# Patient Record
Sex: Female | Born: 1960
Health system: Southern US, Community
[De-identification: ages and names within clinical notes are randomized; demographics above are authoritative.]

## PROBLEM LIST (undated history)

## (undated) DIAGNOSIS — M797 Fibromyalgia: Secondary | ICD-10-CM

## (undated) DIAGNOSIS — F419 Anxiety disorder, unspecified: Secondary | ICD-10-CM

## (undated) DIAGNOSIS — G709 Myoneural disorder, unspecified: Secondary | ICD-10-CM

## (undated) DIAGNOSIS — H269 Unspecified cataract: Secondary | ICD-10-CM

## (undated) DIAGNOSIS — F329 Major depressive disorder, single episode, unspecified: Secondary | ICD-10-CM

## (undated) DIAGNOSIS — I10 Essential (primary) hypertension: Secondary | ICD-10-CM

## (undated) DIAGNOSIS — F32A Depression, unspecified: Secondary | ICD-10-CM

## (undated) DIAGNOSIS — E119 Type 2 diabetes mellitus without complications: Secondary | ICD-10-CM

## (undated) HISTORY — DX: Type 2 diabetes mellitus without complications: E11.9

## (undated) HISTORY — PX: BILATERAL CARPAL TUNNEL RELEASE: SHX6508

## (undated) HISTORY — PX: BREAST BIOPSY: SHX20

## (undated) HISTORY — DX: Essential (primary) hypertension: I10

## (undated) HISTORY — PX: FRACTURE SURGERY: SHX138

## (undated) HISTORY — DX: Unspecified cataract: H26.9

## (undated) HISTORY — PX: CHOLECYSTECTOMY: SHX55

## (undated) HISTORY — DX: Depression, unspecified: F32.A

## (undated) HISTORY — DX: Anxiety disorder, unspecified: F41.9

## (undated) HISTORY — DX: Myoneural disorder, unspecified: G70.9

## (undated) HISTORY — PX: HERNIA REPAIR: SHX51

---
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
## 2014-06-28 DIAGNOSIS — E1140 Type 2 diabetes mellitus with diabetic neuropathy, unspecified: Secondary | ICD-10-CM | POA: Diagnosis not present

## 2014-06-28 DIAGNOSIS — I10 Essential (primary) hypertension: Secondary | ICD-10-CM | POA: Diagnosis not present

## 2014-06-28 DIAGNOSIS — R5383 Other fatigue: Secondary | ICD-10-CM | POA: Diagnosis not present

## 2014-06-28 DIAGNOSIS — E785 Hyperlipidemia, unspecified: Secondary | ICD-10-CM | POA: Diagnosis not present

## 2014-07-02 DIAGNOSIS — E785 Hyperlipidemia, unspecified: Secondary | ICD-10-CM | POA: Diagnosis not present

## 2014-07-02 DIAGNOSIS — E039 Hypothyroidism, unspecified: Secondary | ICD-10-CM | POA: Diagnosis not present

## 2014-07-02 DIAGNOSIS — I10 Essential (primary) hypertension: Secondary | ICD-10-CM | POA: Diagnosis not present

## 2014-07-02 DIAGNOSIS — E1140 Type 2 diabetes mellitus with diabetic neuropathy, unspecified: Secondary | ICD-10-CM | POA: Diagnosis not present

## 2014-07-02 DIAGNOSIS — R5383 Other fatigue: Secondary | ICD-10-CM | POA: Diagnosis not present

## 2014-08-08 DIAGNOSIS — Z6838 Body mass index (BMI) 38.0-38.9, adult: Secondary | ICD-10-CM | POA: Diagnosis not present

## 2014-08-08 DIAGNOSIS — M549 Dorsalgia, unspecified: Secondary | ICD-10-CM | POA: Diagnosis not present

## 2014-08-08 DIAGNOSIS — F339 Major depressive disorder, recurrent, unspecified: Secondary | ICD-10-CM | POA: Diagnosis not present

## 2014-08-08 DIAGNOSIS — I10 Essential (primary) hypertension: Secondary | ICD-10-CM | POA: Diagnosis not present

## 2014-08-08 DIAGNOSIS — E785 Hyperlipidemia, unspecified: Secondary | ICD-10-CM | POA: Diagnosis not present

## 2014-08-08 DIAGNOSIS — Z029 Encounter for administrative examinations, unspecified: Secondary | ICD-10-CM | POA: Diagnosis not present

## 2014-10-17 DIAGNOSIS — I10 Essential (primary) hypertension: Secondary | ICD-10-CM | POA: Diagnosis not present

## 2014-10-17 DIAGNOSIS — G629 Polyneuropathy, unspecified: Secondary | ICD-10-CM | POA: Diagnosis not present

## 2014-10-17 DIAGNOSIS — E785 Hyperlipidemia, unspecified: Secondary | ICD-10-CM | POA: Diagnosis not present

## 2014-10-17 DIAGNOSIS — E1140 Type 2 diabetes mellitus with diabetic neuropathy, unspecified: Secondary | ICD-10-CM | POA: Diagnosis not present

## 2014-10-17 DIAGNOSIS — F329 Major depressive disorder, single episode, unspecified: Secondary | ICD-10-CM | POA: Diagnosis not present

## 2014-10-17 DIAGNOSIS — Z6838 Body mass index (BMI) 38.0-38.9, adult: Secondary | ICD-10-CM | POA: Diagnosis not present

## 2014-12-25 DIAGNOSIS — F332 Major depressive disorder, recurrent severe without psychotic features: Secondary | ICD-10-CM | POA: Diagnosis not present

## 2015-01-17 DIAGNOSIS — G4700 Insomnia, unspecified: Secondary | ICD-10-CM | POA: Diagnosis not present

## 2015-01-17 DIAGNOSIS — E1140 Type 2 diabetes mellitus with diabetic neuropathy, unspecified: Secondary | ICD-10-CM | POA: Diagnosis not present

## 2015-01-17 DIAGNOSIS — E785 Hyperlipidemia, unspecified: Secondary | ICD-10-CM | POA: Diagnosis not present

## 2015-01-17 DIAGNOSIS — F339 Major depressive disorder, recurrent, unspecified: Secondary | ICD-10-CM | POA: Diagnosis not present

## 2015-01-17 DIAGNOSIS — G8929 Other chronic pain: Secondary | ICD-10-CM | POA: Diagnosis not present

## 2015-01-17 DIAGNOSIS — I10 Essential (primary) hypertension: Secondary | ICD-10-CM | POA: Diagnosis not present

## 2015-02-26 DIAGNOSIS — F332 Major depressive disorder, recurrent severe without psychotic features: Secondary | ICD-10-CM | POA: Diagnosis not present

## 2015-04-24 DIAGNOSIS — I10 Essential (primary) hypertension: Secondary | ICD-10-CM | POA: Diagnosis not present

## 2015-04-24 DIAGNOSIS — F329 Major depressive disorder, single episode, unspecified: Secondary | ICD-10-CM | POA: Diagnosis not present

## 2015-04-24 DIAGNOSIS — G4700 Insomnia, unspecified: Secondary | ICD-10-CM | POA: Diagnosis not present

## 2015-04-24 DIAGNOSIS — G629 Polyneuropathy, unspecified: Secondary | ICD-10-CM | POA: Diagnosis not present

## 2015-04-24 DIAGNOSIS — E1140 Type 2 diabetes mellitus with diabetic neuropathy, unspecified: Secondary | ICD-10-CM | POA: Diagnosis not present

## 2015-04-24 DIAGNOSIS — Z6837 Body mass index (BMI) 37.0-37.9, adult: Secondary | ICD-10-CM | POA: Diagnosis not present

## 2015-07-23 DIAGNOSIS — Z6838 Body mass index (BMI) 38.0-38.9, adult: Secondary | ICD-10-CM | POA: Diagnosis not present

## 2015-07-23 DIAGNOSIS — I10 Essential (primary) hypertension: Secondary | ICD-10-CM | POA: Diagnosis not present

## 2015-07-23 DIAGNOSIS — E1140 Type 2 diabetes mellitus with diabetic neuropathy, unspecified: Secondary | ICD-10-CM | POA: Diagnosis not present

## 2015-07-23 DIAGNOSIS — G8929 Other chronic pain: Secondary | ICD-10-CM | POA: Diagnosis not present

## 2015-07-23 DIAGNOSIS — E039 Hypothyroidism, unspecified: Secondary | ICD-10-CM | POA: Diagnosis not present

## 2015-07-23 DIAGNOSIS — E785 Hyperlipidemia, unspecified: Secondary | ICD-10-CM | POA: Diagnosis not present

## 2015-07-23 DIAGNOSIS — F329 Major depressive disorder, single episode, unspecified: Secondary | ICD-10-CM | POA: Diagnosis not present

## 2015-07-23 DIAGNOSIS — G4700 Insomnia, unspecified: Secondary | ICD-10-CM | POA: Diagnosis not present

## 2015-07-31 DIAGNOSIS — M25511 Pain in right shoulder: Secondary | ICD-10-CM | POA: Diagnosis not present

## 2015-08-06 DIAGNOSIS — F332 Major depressive disorder, recurrent severe without psychotic features: Secondary | ICD-10-CM | POA: Diagnosis not present

## 2015-10-15 DIAGNOSIS — M797 Fibromyalgia: Secondary | ICD-10-CM | POA: Diagnosis not present

## 2015-10-15 DIAGNOSIS — E1149 Type 2 diabetes mellitus with other diabetic neurological complication: Secondary | ICD-10-CM | POA: Diagnosis not present

## 2015-10-15 DIAGNOSIS — I10 Essential (primary) hypertension: Secondary | ICD-10-CM | POA: Diagnosis not present

## 2015-10-15 DIAGNOSIS — E782 Mixed hyperlipidemia: Secondary | ICD-10-CM | POA: Diagnosis not present

## 2015-10-15 DIAGNOSIS — M545 Low back pain: Secondary | ICD-10-CM | POA: Diagnosis not present

## 2015-10-22 DIAGNOSIS — F332 Major depressive disorder, recurrent severe without psychotic features: Secondary | ICD-10-CM | POA: Diagnosis not present

## 2015-11-15 DIAGNOSIS — N39498 Other specified urinary incontinence: Secondary | ICD-10-CM | POA: Diagnosis not present

## 2015-11-15 DIAGNOSIS — E782 Mixed hyperlipidemia: Secondary | ICD-10-CM | POA: Diagnosis not present

## 2015-11-15 DIAGNOSIS — E559 Vitamin D deficiency, unspecified: Secondary | ICD-10-CM | POA: Diagnosis not present

## 2015-11-15 DIAGNOSIS — E119 Type 2 diabetes mellitus without complications: Secondary | ICD-10-CM | POA: Diagnosis not present

## 2015-11-15 DIAGNOSIS — H906 Mixed conductive and sensorineural hearing loss, bilateral: Secondary | ICD-10-CM | POA: Diagnosis not present

## 2015-11-15 DIAGNOSIS — M545 Low back pain: Secondary | ICD-10-CM | POA: Diagnosis not present

## 2015-11-15 DIAGNOSIS — I10 Essential (primary) hypertension: Secondary | ICD-10-CM | POA: Diagnosis not present

## 2015-11-15 DIAGNOSIS — M7541 Impingement syndrome of right shoulder: Secondary | ICD-10-CM | POA: Diagnosis not present

## 2016-04-14 DIAGNOSIS — F332 Major depressive disorder, recurrent severe without psychotic features: Secondary | ICD-10-CM | POA: Diagnosis not present

## 2016-06-17 DIAGNOSIS — E119 Type 2 diabetes mellitus without complications: Secondary | ICD-10-CM | POA: Diagnosis not present

## 2016-06-17 DIAGNOSIS — Z6837 Body mass index (BMI) 37.0-37.9, adult: Secondary | ICD-10-CM | POA: Diagnosis not present

## 2016-06-17 DIAGNOSIS — M797 Fibromyalgia: Secondary | ICD-10-CM | POA: Diagnosis not present

## 2016-06-17 DIAGNOSIS — Z1389 Encounter for screening for other disorder: Secondary | ICD-10-CM | POA: Diagnosis not present

## 2016-09-30 DIAGNOSIS — I10 Essential (primary) hypertension: Secondary | ICD-10-CM | POA: Diagnosis not present

## 2016-09-30 DIAGNOSIS — E669 Obesity, unspecified: Secondary | ICD-10-CM | POA: Diagnosis not present

## 2016-09-30 DIAGNOSIS — Z6834 Body mass index (BMI) 34.0-34.9, adult: Secondary | ICD-10-CM | POA: Diagnosis not present

## 2016-09-30 DIAGNOSIS — E785 Hyperlipidemia, unspecified: Secondary | ICD-10-CM | POA: Diagnosis not present

## 2016-09-30 DIAGNOSIS — E119 Type 2 diabetes mellitus without complications: Secondary | ICD-10-CM | POA: Diagnosis not present

## 2017-02-11 DIAGNOSIS — H524 Presbyopia: Secondary | ICD-10-CM | POA: Diagnosis not present

## 2017-02-11 DIAGNOSIS — H25813 Combined forms of age-related cataract, bilateral: Secondary | ICD-10-CM | POA: Diagnosis not present

## 2017-05-24 DIAGNOSIS — E119 Type 2 diabetes mellitus without complications: Secondary | ICD-10-CM | POA: Diagnosis not present

## 2017-05-24 DIAGNOSIS — Z136 Encounter for screening for cardiovascular disorders: Secondary | ICD-10-CM | POA: Diagnosis not present

## 2017-05-24 DIAGNOSIS — I10 Essential (primary) hypertension: Secondary | ICD-10-CM | POA: Diagnosis not present

## 2017-05-24 DIAGNOSIS — Z131 Encounter for screening for diabetes mellitus: Secondary | ICD-10-CM | POA: Diagnosis not present

## 2017-05-24 DIAGNOSIS — E785 Hyperlipidemia, unspecified: Secondary | ICD-10-CM | POA: Diagnosis not present

## 2017-05-24 DIAGNOSIS — E559 Vitamin D deficiency, unspecified: Secondary | ICD-10-CM | POA: Diagnosis not present

## 2017-06-11 DIAGNOSIS — E1165 Type 2 diabetes mellitus with hyperglycemia: Secondary | ICD-10-CM | POA: Diagnosis not present

## 2017-06-11 DIAGNOSIS — G629 Polyneuropathy, unspecified: Secondary | ICD-10-CM | POA: Diagnosis not present

## 2017-06-11 DIAGNOSIS — E785 Hyperlipidemia, unspecified: Secondary | ICD-10-CM | POA: Diagnosis not present

## 2017-08-23 DIAGNOSIS — E559 Vitamin D deficiency, unspecified: Secondary | ICD-10-CM | POA: Diagnosis not present

## 2017-08-23 DIAGNOSIS — I10 Essential (primary) hypertension: Secondary | ICD-10-CM | POA: Diagnosis not present

## 2017-08-23 DIAGNOSIS — G629 Polyneuropathy, unspecified: Secondary | ICD-10-CM | POA: Diagnosis not present

## 2017-08-23 DIAGNOSIS — E1165 Type 2 diabetes mellitus with hyperglycemia: Secondary | ICD-10-CM | POA: Diagnosis not present

## 2017-08-23 DIAGNOSIS — E785 Hyperlipidemia, unspecified: Secondary | ICD-10-CM | POA: Diagnosis not present

## 2017-11-05 DIAGNOSIS — S6991XA Unspecified injury of right wrist, hand and finger(s), initial encounter: Secondary | ICD-10-CM | POA: Diagnosis not present

## 2017-11-05 DIAGNOSIS — S63641A Sprain of metacarpophalangeal joint of right thumb, initial encounter: Secondary | ICD-10-CM | POA: Diagnosis not present

## 2017-11-22 DIAGNOSIS — M797 Fibromyalgia: Secondary | ICD-10-CM | POA: Diagnosis not present

## 2017-11-22 DIAGNOSIS — F333 Major depressive disorder, recurrent, severe with psychotic symptoms: Secondary | ICD-10-CM | POA: Diagnosis not present

## 2017-11-22 DIAGNOSIS — Z1379 Encounter for other screening for genetic and chromosomal anomalies: Secondary | ICD-10-CM | POA: Diagnosis not present

## 2017-11-22 DIAGNOSIS — F419 Anxiety disorder, unspecified: Secondary | ICD-10-CM | POA: Diagnosis not present

## 2017-11-22 DIAGNOSIS — E1165 Type 2 diabetes mellitus with hyperglycemia: Secondary | ICD-10-CM | POA: Diagnosis not present

## 2017-11-22 DIAGNOSIS — R0602 Shortness of breath: Secondary | ICD-10-CM | POA: Diagnosis not present

## 2017-11-22 DIAGNOSIS — F330 Major depressive disorder, recurrent, mild: Secondary | ICD-10-CM | POA: Diagnosis not present

## 2017-11-22 DIAGNOSIS — E119 Type 2 diabetes mellitus without complications: Secondary | ICD-10-CM | POA: Diagnosis not present

## 2017-11-22 DIAGNOSIS — I10 Essential (primary) hypertension: Secondary | ICD-10-CM | POA: Diagnosis not present

## 2017-11-26 DIAGNOSIS — E119 Type 2 diabetes mellitus without complications: Secondary | ICD-10-CM | POA: Diagnosis not present

## 2017-11-26 DIAGNOSIS — E785 Hyperlipidemia, unspecified: Secondary | ICD-10-CM | POA: Diagnosis not present

## 2017-12-28 DIAGNOSIS — F419 Anxiety disorder, unspecified: Secondary | ICD-10-CM | POA: Diagnosis not present

## 2017-12-28 DIAGNOSIS — E1165 Type 2 diabetes mellitus with hyperglycemia: Secondary | ICD-10-CM | POA: Diagnosis not present

## 2017-12-28 DIAGNOSIS — E785 Hyperlipidemia, unspecified: Secondary | ICD-10-CM | POA: Diagnosis not present

## 2017-12-28 DIAGNOSIS — I10 Essential (primary) hypertension: Secondary | ICD-10-CM | POA: Diagnosis not present

## 2018-01-03 DIAGNOSIS — E119 Type 2 diabetes mellitus without complications: Secondary | ICD-10-CM | POA: Diagnosis not present

## 2018-01-03 DIAGNOSIS — H2513 Age-related nuclear cataract, bilateral: Secondary | ICD-10-CM | POA: Diagnosis not present

## 2018-03-01 DIAGNOSIS — F330 Major depressive disorder, recurrent, mild: Secondary | ICD-10-CM | POA: Diagnosis not present

## 2018-03-01 DIAGNOSIS — E1165 Type 2 diabetes mellitus with hyperglycemia: Secondary | ICD-10-CM | POA: Diagnosis not present

## 2018-03-01 DIAGNOSIS — E785 Hyperlipidemia, unspecified: Secondary | ICD-10-CM | POA: Diagnosis not present

## 2018-03-01 DIAGNOSIS — I10 Essential (primary) hypertension: Secondary | ICD-10-CM | POA: Diagnosis not present

## 2018-05-02 ENCOUNTER — Other Ambulatory Visit: Payer: Self-pay | Admitting: *Deleted

## 2018-05-02 NOTE — Patient Outreach (Signed)
[facility](THN) Care Management  05/02/2018  Toni Chambers November 22, 1960 [id number]   Telephone Screen  Referral Date: 04/29/18 Referral Source: MD referral/HTA UM - Lorinda CreedK Evans-stanley, HTA UM -medium 10 day referral  Referral Reason: member is struggling with affording her insulins since she has entered the coverage gap Insurance: HTA    Outreach attempt # 1 No answer. TH[facility] RN CM left HIPAA compliant voicemail message along with CM's contact info.   Plan: [facility] CM sent an unsuccessful outreach letter and scheduled this patient for another call attempt within 4 business days  [name] L. Noelle PennerGibbs, RN, BSN, CCM [facility] Telephonic Care Management Care Coordinator Office number [phone] Mobile number [phone]  Main [facility] number [phone] Fax number [phone] '

## 2018-05-03 ENCOUNTER — Other Ambulatory Visit: Payer: Self-pay | Admitting: *Deleted

## 2018-05-03 NOTE — Patient Outreach (Addendum)
[facility]TH[facility]) Care Management  05/03/2018  Toni Chambers January 01, 1961 [id number]   Telephone Screen  Referral Date: 04/29/18 Referral Source: MD referral/HTA UM - Lorinda CreedK Evans-stanley, HTA UM -medium 10 day referral  Referral Reason: member is struggling with affording her insulins since she has entered the coverage gap Insurance: HTA    Outreach attempt # 2 No answer, busy signals at [phone] No voicemail message could be left   No answer. [facility] RN CM left HIPAA compliant voicemail message along with CM's contact info at [phone]  Plan: [facility] RN CM scheduled this patient for another call attempt within 4 business days  Zula Hovsepian L. Noelle PennerGibbs, RN, BSN, CCM [facility] Telephonic Care Management Care Coordinator Office number [phone] Mobile number [phone]  Main [facility] number [phone] Fax number [phone] '

## 2018-05-09 ENCOUNTER — Other Ambulatory Visit: Payer: Self-pay | Admitting: *Deleted

## 2018-05-09 NOTE — Patient Outreach (Signed)
[facility](THN) Care Management  05/09/2018  Toni Chambers [birth year] [id number]   Telephone Screen  Referral Date:04/29/18 Referral Source:MD referral/HTA UM - K Evans-stanley, HTA UM -medium 10 day referral Referral Reason:member is struggling with affording her insulins since she has entered the coverage gap Insurance:HTA   Outreach attempt # 3  No answer. [facility] RN CM left HIPAA compliant voicemail message along with CM's contact info at [phone]   Plan: [facility] CM scheduled this patient for case closure within 4 business days  Hudson BendKimberly L. Noelle PennerGibbs, RN, BSN, CCM [facility] Telephonic Care Management Care Coordinator Office number [phone] Mobile number [phone]  Main [facility] number [phone] Fax number [phone]

## 2018-05-13 ENCOUNTER — Other Ambulatory Visit: Payer: Self-pay | Admitting: *Deleted

## 2018-05-13 NOTE — Patient Outreach (Signed)
[facility]) Care Management  05/13/2018  NIKHILA STONER 1960-09-27 [id number]   Case closure for  Referral Date:04/29/18 Referral Source:MD referral/HTA UM - K Evans-stanley, HTA UM -medium 10 day referral Referral Reason:member is struggling with affording her insulins since she has entered the coverage gap Insurance:HTA  Call attempts made on 05/02/18, 05/03/18  and 04/3018 Unsuccessful outreach letter sent on 05/02/18  without a response   Plan [facility] RN CM will close case after no response from patient within 10 business days. Unable to reach [facility] RN CM called the primary care office and left a voice message for the nurse line to inform the office of the concerns voiced in the referral CM stated no response from the patient and left Cm contact number  Routed note to care team members  [place] L. Noelle Penner, RN, BSN, CCM [facility] Telephonic Care Management Care Coordinator Office number [phone] Mobile number [phone]  Main [facility] number [phone] Fax number [phone]

## 2018-05-16 ENCOUNTER — Other Ambulatory Visit: Payer: Self-pay

## 2018-05-16 NOTE — Patient Outreach (Signed)
[facility]) Care Management  05/16/2018  Toni Chambers [birth year] [id number]  TELEPHONE SCREENING Referral date: 04/29/18 Referral source: primary MD Referral reason: unable to afford medication  Insurance: Health team advantage.  Telephone call to patient regarding primary MD referral. HIPAA verified. Explained reason for call. Patient states she was in the coverage gap with her insulin. She state she no longer is in the coverage gap and is able to afford her medication. Patient states she was on 15 units of her insulin and paying a copay for a 90 day supply. Patient states she is now on 20 units of insulin and is paying a copay for a 90 day supply but only getting 75 days worth.  Patient states she is going to talk with her doctor about increasing her oral diabetic medication and possibly decreasing her insulin back to 15 units so that she can get a full 90 day supply.  RNCM discussed and offered [facility] care management services to patient.  Patient declined stating she would talk with her primary care provider first then call [facility] care management if she felt additional assistance was needed.   Patient states she is considering changing her primary care provider. RNCM advised patient to call her HTA concierge and request list of participating providers in her area. Patient verbalized understanding.   PLAN:  RNCM will close patient due to being assessed and having no further needs.  RNCM will send patient [facility] care management brochure/ magnet RNCM will send closure letter to patients primary MD   George Ina RN,BSN,CCM [facility] Telephonic  [phone]

## 2018-06-16 DIAGNOSIS — Z7689 Persons encountering health services in other specified circumstances: Secondary | ICD-10-CM | POA: Diagnosis not present

## 2018-06-16 DIAGNOSIS — E782 Mixed hyperlipidemia: Secondary | ICD-10-CM | POA: Diagnosis not present

## 2018-06-16 DIAGNOSIS — E1159 Type 2 diabetes mellitus with other circulatory complications: Secondary | ICD-10-CM | POA: Diagnosis not present

## 2018-06-16 DIAGNOSIS — I10 Essential (primary) hypertension: Secondary | ICD-10-CM | POA: Diagnosis not present

## 2018-06-16 DIAGNOSIS — E1169 Type 2 diabetes mellitus with other specified complication: Secondary | ICD-10-CM | POA: Diagnosis not present

## 2018-06-23 DIAGNOSIS — E1165 Type 2 diabetes mellitus with hyperglycemia: Secondary | ICD-10-CM | POA: Diagnosis not present

## 2018-06-23 DIAGNOSIS — E1169 Type 2 diabetes mellitus with other specified complication: Secondary | ICD-10-CM | POA: Diagnosis not present

## 2018-06-23 DIAGNOSIS — E1159 Type 2 diabetes mellitus with other circulatory complications: Secondary | ICD-10-CM | POA: Diagnosis not present

## 2018-06-23 DIAGNOSIS — E785 Hyperlipidemia, unspecified: Secondary | ICD-10-CM | POA: Diagnosis not present

## 2018-07-13 DIAGNOSIS — E1165 Type 2 diabetes mellitus with hyperglycemia: Secondary | ICD-10-CM | POA: Diagnosis not present

## 2018-07-13 DIAGNOSIS — Z6836 Body mass index (BMI) 36.0-36.9, adult: Secondary | ICD-10-CM | POA: Diagnosis not present

## 2018-09-14 DIAGNOSIS — E1165 Type 2 diabetes mellitus with hyperglycemia: Secondary | ICD-10-CM | POA: Diagnosis not present

## 2018-09-14 DIAGNOSIS — E1159 Type 2 diabetes mellitus with other circulatory complications: Secondary | ICD-10-CM | POA: Diagnosis not present

## 2018-09-14 DIAGNOSIS — E1169 Type 2 diabetes mellitus with other specified complication: Secondary | ICD-10-CM | POA: Diagnosis not present

## 2018-09-22 DIAGNOSIS — E1169 Type 2 diabetes mellitus with other specified complication: Secondary | ICD-10-CM | POA: Diagnosis not present

## 2018-09-22 DIAGNOSIS — E785 Hyperlipidemia, unspecified: Secondary | ICD-10-CM | POA: Diagnosis not present

## 2018-09-22 DIAGNOSIS — E1159 Type 2 diabetes mellitus with other circulatory complications: Secondary | ICD-10-CM | POA: Diagnosis not present

## 2018-09-22 DIAGNOSIS — I10 Essential (primary) hypertension: Secondary | ICD-10-CM | POA: Diagnosis not present

## 2018-10-18 DIAGNOSIS — I10 Essential (primary) hypertension: Secondary | ICD-10-CM | POA: Diagnosis not present

## 2018-10-18 DIAGNOSIS — E1159 Type 2 diabetes mellitus with other circulatory complications: Secondary | ICD-10-CM | POA: Diagnosis not present

## 2018-11-16 DIAGNOSIS — I160 Hypertensive urgency: Secondary | ICD-10-CM | POA: Diagnosis not present

## 2018-11-16 DIAGNOSIS — Z87891 Personal history of nicotine dependence: Secondary | ICD-10-CM | POA: Diagnosis not present

## 2018-11-16 DIAGNOSIS — R200 Anesthesia of skin: Secondary | ICD-10-CM | POA: Diagnosis not present

## 2018-11-16 DIAGNOSIS — R51 Headache: Secondary | ICD-10-CM | POA: Diagnosis not present

## 2018-11-16 DIAGNOSIS — I208 Other forms of angina pectoris: Secondary | ICD-10-CM | POA: Diagnosis not present

## 2018-11-16 DIAGNOSIS — R739 Hyperglycemia, unspecified: Secondary | ICD-10-CM | POA: Diagnosis not present

## 2018-11-17 DIAGNOSIS — I208 Other forms of angina pectoris: Secondary | ICD-10-CM | POA: Diagnosis not present

## 2018-12-22 DIAGNOSIS — E1159 Type 2 diabetes mellitus with other circulatory complications: Secondary | ICD-10-CM | POA: Diagnosis not present

## 2018-12-22 DIAGNOSIS — E785 Hyperlipidemia, unspecified: Secondary | ICD-10-CM | POA: Diagnosis not present

## 2018-12-22 DIAGNOSIS — Z0000 Encounter for general adult medical examination without abnormal findings: Secondary | ICD-10-CM | POA: Diagnosis not present

## 2018-12-22 DIAGNOSIS — E1169 Type 2 diabetes mellitus with other specified complication: Secondary | ICD-10-CM | POA: Diagnosis not present

## 2018-12-22 DIAGNOSIS — Z789 Other specified health status: Secondary | ICD-10-CM | POA: Diagnosis not present

## 2018-12-22 DIAGNOSIS — Z139 Encounter for screening, unspecified: Secondary | ICD-10-CM | POA: Diagnosis not present

## 2018-12-22 DIAGNOSIS — F329 Major depressive disorder, single episode, unspecified: Secondary | ICD-10-CM | POA: Diagnosis not present

## 2018-12-30 NOTE — Patient Outreach (Signed)
Received a referral from Dr. Nyra Capes, Toni Chambers has HTA insurance, I have transferred the referral to HTA through [facility] email [email or web address] per workflow.

## 2019-01-04 ENCOUNTER — Telehealth: Payer: Self-pay | Admitting: Pharmacist

## 2019-01-04 DIAGNOSIS — Z794 Long term (current) use of insulin: Secondary | ICD-10-CM

## 2019-01-04 DIAGNOSIS — E1169 Type 2 diabetes mellitus with other specified complication: Secondary | ICD-10-CM

## 2019-01-04 DIAGNOSIS — I152 Hypertension secondary to endocrine disorders: Secondary | ICD-10-CM

## 2019-01-04 DIAGNOSIS — E119 Type 2 diabetes mellitus without complications: Secondary | ICD-10-CM

## 2019-01-04 DIAGNOSIS — E1159 Type 2 diabetes mellitus with other circulatory complications: Secondary | ICD-10-CM

## 2019-01-04 DIAGNOSIS — E559 Vitamin D deficiency, unspecified: Secondary | ICD-10-CM

## 2019-01-05 DIAGNOSIS — E559 Vitamin D deficiency, unspecified: Secondary | ICD-10-CM | POA: Insufficient documentation

## 2019-01-05 DIAGNOSIS — I152 Hypertension secondary to endocrine disorders: Secondary | ICD-10-CM | POA: Insufficient documentation

## 2019-01-05 DIAGNOSIS — E119 Type 2 diabetes mellitus without complications: Secondary | ICD-10-CM | POA: Insufficient documentation

## 2019-01-05 DIAGNOSIS — E1159 Type 2 diabetes mellitus with other circulatory complications: Secondary | ICD-10-CM | POA: Insufficient documentation

## 2019-01-05 DIAGNOSIS — E1169 Type 2 diabetes mellitus with other specified complication: Secondary | ICD-10-CM | POA: Insufficient documentation

## 2019-01-05 DIAGNOSIS — H2513 Age-related nuclear cataract, bilateral: Secondary | ICD-10-CM | POA: Diagnosis not present

## 2019-01-05 NOTE — Patient Outreach (Addendum)
[facility](THN) Care Management  [facility]   01/05/2019  Toni Chambers 1960-11-16 [id number]  Reason for referral: Medication Assistance  Referral source: [place] Referral medication(s): Levemir  Current insurance:HTA  PMHx:  Type 2 diabetes, hypertension, hyperlipidemia, vitamin D deficiency   Objective: Allergies  Allergen Reactions  . Codeine Nausea And Vomiting    Feels like sea sickness  . Wellbutrin [Bupropion] Anxiety    Hallucinations  . Tramadol Other (See Comments)    Headache  . Trazodone And Nefazodone Other (See Comments)    Headache    Medications Reviewed Today    Reviewed by Toni Chambers, Toni Chambers, [facility] (Pharmacist) on 01/05/19 at 1127  Med List Status: <None>  Medication Order Taking? Sig Documenting Provider Last Dose Status Informant  aspirin EC 81 MG tablet [id number] Yes Take 81 mg by mouth daily. [provider] Taking Active   atorvastatin (LIPITOR) 40 MG tablet [id number] Yes Take 40 mg by mouth daily. [provider] Taking Active   DULoxetine (CYMBALTA) 20 MG capsule [id number] Yes Take 20 mg by mouth daily. [provider] Taking Active   empagliflozin (JARDIANCE) 10 MG TABS tablet [id number] Yes Take 10 mg by mouth daily. [provider] Taking Active   ergocalciferol (VITAMIN D2) 1.25 MG (50000 UT) capsule [id number] Yes Take 50,000 Units by mouth once a week. [provider] Taking Active   glipiZIDE (GLUCOTROL XL) 2.5 MG 24 hr tablet [id number] Yes Take 2.5 mg by mouth daily with breakfast. [provider] Taking Active   Icosapent Ethyl (VASCEPA PO) [id number] Yes Take by mouth. [provider] Taking Active   insulin detemir (LEVEMIR) 100 UNIT/ML injection [id number] Yes Inject 30 Units into the skin daily. [provider] Taking Active   lisinopril (ZESTRIL) 10 MG tablet [id number] Yes Take 10 mg by mouth daily. [provider] Taking Active   nebivolol  (BYSTOLIC) 5 MG tablet [id number] Yes Take 5 mg by mouth daily. [provider] Taking Active           Assessment:  Drugs sorted by system:  Neurologic/Psychologic: Duloxetine  Cardiovascular: Aspirin, Atorvastatin, Lisinopril, Vascepa, Bystolic  Endocrine: Glipizide, Levemir, Jardiance  Vitamins/Minerals/Supplements: Ergocalciferol   Medication Review Findings:  . Patient reported being given Trulicity. However, the referral and provider note sent over listed Bydureon Bcise.  . Adherence-Vascepa was filled but patient could not afford to pick it or the Jardiance up so she is not currently taking either of them.  Medication Assistance Findings:  Medication assistance needs identified: Levemir, Vascepa, Julien GirtByduren Bcise,   According to HTA: [place] $655.65, TDS [phone]$3,568.54  Patient said she had never heard of Bydureon even though it was written on the provider's note. Patient said she used Trulicity in the past and that it really worked for her. She stated she would like to go back on Trulicity.  Patient's provider will be contacted about switching the patient to Trulicity and abo      ut the potential of getting the Levemir switched to Basaglar as both medications are available from [employer] and if approved, delivered to the patient's home.   From initial review, patient appears to qualify for [facility].      Additional medication assistance options reviewed with patient as warranted:   -Completed an application online with [employer].    Plan: I will route patient assistance letter to [facility] pharmacy technician who will coordinate patient assistance program application process for medications listed above.  [facility] pharmacy  technician will assist with obtaining all required documents from both patient and provider(s) and submit application(s) once completed.    Follow up on [place] on Friday  Send patient a [employer] Immediate  Coupon for [employer] and left a message @ Dr. Nyra Capes office about therapeutic substitutions.  Follow up with the patient within 3 business days on the status of the [place] and to get her the [place].  Toni Chambers, PharmD, [place] Clinical Pharmacist [phone]

## 2019-01-06 ENCOUNTER — Other Ambulatory Visit: Payer: Self-pay | Admitting: Pharmacist

## 2019-01-06 ENCOUNTER — Ambulatory Visit: Payer: PPO | Admitting: Pharmacist

## 2019-01-06 NOTE — Patient Outreach (Signed)
[place] [facility]) Care Management  01/06/2019  Toni Chambers [birth year] [id number]  Patient was called to follow up on medication assistance. Unfortunately she did not answer her home phone and the number listed as her mobile number was busy x 3.  An application was completed online for the [employer] earlier this week. From reviewing their website, the patient has an ID but it was unclear what her benefit amount is.  Plan: Send patient an unsuccessful outreach letter. Call patient back in 5-7 business days.   Elayne Guerin, PharmD, [place] Clinical Pharmacist [phone]

## 2019-01-09 DIAGNOSIS — E785 Hyperlipidemia, unspecified: Secondary | ICD-10-CM | POA: Diagnosis not present

## 2019-01-09 DIAGNOSIS — F329 Major depressive disorder, single episode, unspecified: Secondary | ICD-10-CM | POA: Diagnosis not present

## 2019-01-09 DIAGNOSIS — I10 Essential (primary) hypertension: Secondary | ICD-10-CM | POA: Diagnosis not present

## 2019-01-09 DIAGNOSIS — E1169 Type 2 diabetes mellitus with other specified complication: Secondary | ICD-10-CM | POA: Diagnosis not present

## 2019-01-10 ENCOUNTER — Encounter: Payer: Self-pay | Admitting: Pharmacist

## 2019-01-10 ENCOUNTER — Other Ambulatory Visit: Payer: Self-pay | Admitting: Pharmacy Technician

## 2019-01-10 NOTE — Patient Outreach (Signed)
[place] [facility]) Care Management  01/10/2019  BRIYANNA BILLINGHAM 1960/07/24 [id number]                                        Medication Assistance Referral  Referral From: [place]  Medication/Company: Nancee Liter and Trulicity / Lilly Patient application portion:  Education officer, museum portion: Faxed  to Dr Marco Collie Provider address/fax verified via: Office website     Follow up:  Will follow up with patient in 5-10 business days to confirm application(s) have been received.  Cailen Texeira P. Tamaira Ciriello, [place] Management [phone]

## 2019-01-12 DIAGNOSIS — Z6835 Body mass index (BMI) 35.0-35.9, adult: Secondary | ICD-10-CM | POA: Diagnosis not present

## 2019-01-12 DIAGNOSIS — M79641 Pain in right hand: Secondary | ICD-10-CM | POA: Diagnosis not present

## 2019-01-12 DIAGNOSIS — E1159 Type 2 diabetes mellitus with other circulatory complications: Secondary | ICD-10-CM | POA: Diagnosis not present

## 2019-01-12 DIAGNOSIS — I10 Essential (primary) hypertension: Secondary | ICD-10-CM | POA: Diagnosis not present

## 2019-01-13 ENCOUNTER — Other Ambulatory Visit: Payer: Self-pay | Admitting: Pharmacist

## 2019-01-13 ENCOUNTER — Telehealth: Payer: Self-pay | Admitting: Adult Health Nurse Practitioner

## 2019-01-13 NOTE — Telephone Encounter (Signed)
Called patient to schedule Palliative Consult, no answer and unable to leave a message, mailbox is full.  Will follow-up

## 2019-01-13 NOTE — Patient Outreach (Signed)
[facility] [facility]) Care Management  01/13/2019  Toni Chambers 08-08-1960 [id number]   Called patient to follow up on patient assistance. HIPAA identifiers were obtained. Patient confirmed she received her card from [facility] to help pay for Vascepa.  She was instructed to take it to her local pharmacy and request her prescription be "re-billed" to both her insurance and [employer] Well.  Patient was sent patient assistance applications for Trulicity and Basaglar  earlier this week.  Patient said she was going to take the [place] billing information to her local pharmacy today to request the "rebill".  Plan: Susy Frizzle will follow for medication assistance. Call patient back in 4 weeks for follow up.  Elayne Guerin, PharmD, [place] Clinical Pharmacist [phone]

## 2019-01-18 ENCOUNTER — Other Ambulatory Visit: Payer: Self-pay | Admitting: Pharmacy Technician

## 2019-01-18 NOTE — Patient Outreach (Signed)
[place] [facility]) Care Management  01/18/2019  Toni Chambers [birth year] [id number]   ADDENDUM  Incoming call received from patient in regards to [place] application for Trulicity and [employer].  Spoke to patient, HIPAA identifiers verified.  Patient confirmed receiving the application but she has not mailed it back yet. She informed she would try and have it in the mail by the end of the week.  Will followup with patient in 10-15 business days if not received.  [name] P. Sejla Marzano, [place] Management [phone]

## 2019-01-18 NOTE — Patient Outreach (Signed)
[place] [facility]) Care Management  01/18/2019  Toni Chambers September 05, 1960 [id number]    Unsuccessful outreach call placed to patient in regards to [employer] application for [employer] and Trulicity.  Unfortunately patient did not answer the phone. A HIPAA compliant message was NOT able to be left because the patient's mailbox was full and then the call disconnected.  Was calling patient to inquire if she had received the application.  Will followup with patient in 3-7 business days.  Alfredia Desanctis P. Tashi Band, [place] Management [phone]

## 2019-01-25 ENCOUNTER — Telehealth: Payer: Self-pay | Admitting: Hospice

## 2019-01-25 NOTE — Telephone Encounter (Signed)
Called Dr. Eustaquio Maize Hodges's office back to let them know that patient wanted to know why Palliative was ordered for her.  RN said she will need to speak with Dr. Nyra Capes when she returns to the office tomorrow about this and she will call me back.

## 2019-01-26 DIAGNOSIS — M1811 Unilateral primary osteoarthritis of first carpometacarpal joint, right hand: Secondary | ICD-10-CM | POA: Diagnosis not present

## 2019-01-26 DIAGNOSIS — M65331 Trigger finger, right middle finger: Secondary | ICD-10-CM | POA: Diagnosis not present

## 2019-02-01 ENCOUNTER — Other Ambulatory Visit: Payer: Self-pay | Admitting: Pharmacy Technician

## 2019-02-01 NOTE — Patient Outreach (Signed)
[place] [facility]) Care Management  02/01/2019  Toni Chambers 1961-01-19 [id number]  Successful outreach call placed to patient in regards to [employer] application for [employer] and Trulicity.  Spoke to patient, HIPAA identifiers verified.  Patient informed she just received the information she requested from social security. She informed she would place all information in the mail this week.  Will followup with patient in 10-15 business days if information and application has not been received.  [name] P. Brynlei Klausner, [place] Management [phone]

## 2019-02-02 ENCOUNTER — Other Ambulatory Visit: Payer: Self-pay | Admitting: Pharmacy Technician

## 2019-02-02 DIAGNOSIS — Z6835 Body mass index (BMI) 35.0-35.9, adult: Secondary | ICD-10-CM | POA: Diagnosis not present

## 2019-02-02 NOTE — Patient Outreach (Signed)
[place] [facility]) Care Management  02/02/2019  Toni Chambers Sep 29, 1960 [id number]    Incoming call received from patient in regards to [place] application for [name].  Spoke to patient, HIPAA identifiers verified.  Patient was calling to inquire if I had received her application in the mail. Informed patient I had not received it yet but would be looking for them. Informed patient once I receive them, I will submit them and then followup with her regarding the determine. Patient verbalized understanding.  Will followup with patient as previously scheduled.  [name], [name] Management [phone]

## 2019-02-07 ENCOUNTER — Other Ambulatory Visit: Payer: Self-pay | Admitting: Pharmacy Technician

## 2019-02-07 NOTE — Patient Outreach (Signed)
[facility]) Care Management  02/07/2019  Toni Chambers 09-Feb-1961 [id number]    Received all necessary documents and signatures from both patient and provider for Lilly patient assistance for Basaglar and Trulicity.  Sumitted completed application via fax to [employer].  Will followup with Lilly in 5-10 business days to inquire if a determination has been made.  Toni Chambers, [place] Management [phone]

## 2019-02-08 DIAGNOSIS — Z6835 Body mass index (BMI) 35.0-35.9, adult: Secondary | ICD-10-CM | POA: Diagnosis not present

## 2019-02-08 DIAGNOSIS — E1159 Type 2 diabetes mellitus with other circulatory complications: Secondary | ICD-10-CM | POA: Diagnosis not present

## 2019-02-08 DIAGNOSIS — I10 Essential (primary) hypertension: Secondary | ICD-10-CM | POA: Diagnosis not present

## 2019-02-09 DIAGNOSIS — Z6835 Body mass index (BMI) 35.0-35.9, adult: Secondary | ICD-10-CM | POA: Diagnosis not present

## 2019-02-15 ENCOUNTER — Other Ambulatory Visit: Payer: Self-pay | Admitting: Pharmacy Technician

## 2019-02-15 NOTE — Patient Outreach (Signed)
[place] [facility]) Care Management  02/15/2019  Toni Chambers [birth year] [id number]   Care coordination call placed to [place] in regards to patient's application for [place] and Trulicity.  Spoke to [place] who informed patient had been APPROVED 02/07/2019-05/11/2019. She informed an order was placed on 9/29 and 9/30 but there was no tracking information available for her to give me yet.  Care coordination call placed to [place] in regards to [employer] for [employer] and [employer].  Spoke to Leslie who informed both medications are set to be delivered 02/23/2019.  Will followup with patient in 7-10 business days to inquire if medication was received.  Pearly Bartosik P. Timaya Bojarski, [place] Management [phone]

## 2019-02-16 DIAGNOSIS — E1159 Type 2 diabetes mellitus with other circulatory complications: Secondary | ICD-10-CM | POA: Diagnosis not present

## 2019-02-16 DIAGNOSIS — Z6835 Body mass index (BMI) 35.0-35.9, adult: Secondary | ICD-10-CM | POA: Diagnosis not present

## 2019-02-16 DIAGNOSIS — I10 Essential (primary) hypertension: Secondary | ICD-10-CM | POA: Diagnosis not present

## 2019-02-17 ENCOUNTER — Ambulatory Visit: Payer: Self-pay | Admitting: Pharmacist

## 2019-02-17 DIAGNOSIS — R634 Abnormal weight loss: Secondary | ICD-10-CM | POA: Diagnosis not present

## 2019-02-17 DIAGNOSIS — E1159 Type 2 diabetes mellitus with other circulatory complications: Secondary | ICD-10-CM | POA: Diagnosis not present

## 2019-02-17 DIAGNOSIS — I10 Essential (primary) hypertension: Secondary | ICD-10-CM | POA: Diagnosis not present

## 2019-02-17 DIAGNOSIS — Z6834 Body mass index (BMI) 34.0-34.9, adult: Secondary | ICD-10-CM | POA: Diagnosis not present

## 2019-02-22 NOTE — Telephone Encounter (Signed)
Spoke with patient regarding Palliative services and after a lengthy discussion with patient she requested that I call MD office to find out why they had ordered Palliative services for her.  I tried to explain to her why they had ordered Palliative services from the referral that was sent to [place] but she insisted that I call MD office.  Will f/u with MD office and let them know.

## 2019-02-27 ENCOUNTER — Other Ambulatory Visit: Payer: Self-pay | Admitting: Pharmacy Technician

## 2019-02-27 NOTE — Patient Outreach (Signed)
[place] [facility]) Care Management  02/27/2019  MELAYA HOSELTON 1960/10/07 [id number]   Successful outreach call placed to patient in regards to [employer] application for Trulicity and [employer].  Spoke to patient, HIPAA identifiers verified.  Patient informed she received the medications. Patient said she received 4 or 6 boxes of each. Per the [facility] rep, patient should have received 4 month supply of each which is  4 boxes. Patient informed that is probably correct. Discussed refill procedure with patient. Patient verbalized understanding. Confirmed patient had name and number.  Will route note to [place] that patient assistance has been completed. Will remove myself from care team.  Luiz Ochoa. Brennon Otterness, [place] Management [phone]

## 2019-03-06 ENCOUNTER — Ambulatory Visit: Payer: Self-pay | Admitting: Pharmacist

## 2019-03-06 ENCOUNTER — Other Ambulatory Visit: Payer: Self-pay | Admitting: Pharmacist

## 2019-03-06 ENCOUNTER — Telehealth: Payer: Self-pay | Admitting: Hospice

## 2019-03-06 NOTE — Telephone Encounter (Signed)
Called patient to follow-up with her to see if MD office had contacted her to explain to her why they had ordered Palliative services - no answer, left message requesting a call back.  Patient had requested I contact MD office to find out why they ordered Palliative.

## 2019-03-09 DIAGNOSIS — I10 Essential (primary) hypertension: Secondary | ICD-10-CM | POA: Diagnosis not present

## 2019-03-09 DIAGNOSIS — Z6834 Body mass index (BMI) 34.0-34.9, adult: Secondary | ICD-10-CM | POA: Diagnosis not present

## 2019-03-10 DIAGNOSIS — I10 Essential (primary) hypertension: Secondary | ICD-10-CM | POA: Diagnosis not present

## 2019-03-10 DIAGNOSIS — Z6834 Body mass index (BMI) 34.0-34.9, adult: Secondary | ICD-10-CM | POA: Diagnosis not present

## 2019-03-10 DIAGNOSIS — E1159 Type 2 diabetes mellitus with other circulatory complications: Secondary | ICD-10-CM | POA: Diagnosis not present

## 2019-03-14 ENCOUNTER — Telehealth: Payer: Self-pay

## 2019-03-14 NOTE — Telephone Encounter (Signed)
Spoke with patient regarding referral to Palliative Care. Patient shared that decision was made to cancel referral as she is doing well and does not feel this is needed at this time.

## 2019-03-14 NOTE — Telephone Encounter (Signed)
Message left for Tammy with Dr. Nyra Capes office to provide update that patient declined Palliative Care services at this time.

## 2019-04-03 DIAGNOSIS — E1169 Type 2 diabetes mellitus with other specified complication: Secondary | ICD-10-CM | POA: Diagnosis not present

## 2019-04-03 DIAGNOSIS — E1159 Type 2 diabetes mellitus with other circulatory complications: Secondary | ICD-10-CM | POA: Diagnosis not present

## 2019-04-03 DIAGNOSIS — Z23 Encounter for immunization: Secondary | ICD-10-CM | POA: Diagnosis not present

## 2019-04-03 DIAGNOSIS — E785 Hyperlipidemia, unspecified: Secondary | ICD-10-CM | POA: Diagnosis not present

## 2019-04-03 DIAGNOSIS — I10 Essential (primary) hypertension: Secondary | ICD-10-CM | POA: Diagnosis not present

## 2019-04-09 DIAGNOSIS — I10 Essential (primary) hypertension: Secondary | ICD-10-CM | POA: Diagnosis not present

## 2019-04-10 DIAGNOSIS — E1159 Type 2 diabetes mellitus with other circulatory complications: Secondary | ICD-10-CM | POA: Diagnosis not present

## 2019-04-10 DIAGNOSIS — I10 Essential (primary) hypertension: Secondary | ICD-10-CM | POA: Diagnosis not present

## 2019-04-10 DIAGNOSIS — E1169 Type 2 diabetes mellitus with other specified complication: Secondary | ICD-10-CM | POA: Diagnosis not present

## 2019-04-10 DIAGNOSIS — E785 Hyperlipidemia, unspecified: Secondary | ICD-10-CM | POA: Diagnosis not present

## 2019-04-12 ENCOUNTER — Other Ambulatory Visit: Payer: Self-pay | Admitting: Family Medicine

## 2019-04-13 DIAGNOSIS — Z6831 Body mass index (BMI) 31.0-31.9, adult: Secondary | ICD-10-CM | POA: Diagnosis not present

## 2019-04-27 DIAGNOSIS — Z6831 Body mass index (BMI) 31.0-31.9, adult: Secondary | ICD-10-CM | POA: Diagnosis not present

## 2019-05-10 DIAGNOSIS — E785 Hyperlipidemia, unspecified: Secondary | ICD-10-CM | POA: Diagnosis not present

## 2019-05-10 DIAGNOSIS — E1169 Type 2 diabetes mellitus with other specified complication: Secondary | ICD-10-CM | POA: Diagnosis not present

## 2019-05-10 DIAGNOSIS — I10 Essential (primary) hypertension: Secondary | ICD-10-CM | POA: Diagnosis not present

## 2019-05-11 DIAGNOSIS — I10 Essential (primary) hypertension: Secondary | ICD-10-CM | POA: Diagnosis not present

## 2019-06-01 DIAGNOSIS — Z6831 Body mass index (BMI) 31.0-31.9, adult: Secondary | ICD-10-CM | POA: Diagnosis not present

## 2019-06-08 DIAGNOSIS — I152 Hypertension secondary to endocrine disorders: Secondary | ICD-10-CM | POA: Diagnosis not present

## 2019-06-08 DIAGNOSIS — M5136 Other intervertebral disc degeneration, lumbar region: Secondary | ICD-10-CM | POA: Diagnosis not present

## 2019-06-08 DIAGNOSIS — E1159 Type 2 diabetes mellitus with other circulatory complications: Secondary | ICD-10-CM | POA: Diagnosis not present

## 2019-06-08 DIAGNOSIS — M25511 Pain in right shoulder: Secondary | ICD-10-CM | POA: Diagnosis not present

## 2019-06-09 DIAGNOSIS — M5136 Other intervertebral disc degeneration, lumbar region: Secondary | ICD-10-CM | POA: Diagnosis not present

## 2019-06-09 DIAGNOSIS — E1159 Type 2 diabetes mellitus with other circulatory complications: Secondary | ICD-10-CM | POA: Diagnosis not present

## 2019-06-09 DIAGNOSIS — I152 Hypertension secondary to endocrine disorders: Secondary | ICD-10-CM | POA: Diagnosis not present

## 2019-06-15 DIAGNOSIS — Z6829 Body mass index (BMI) 29.0-29.9, adult: Secondary | ICD-10-CM | POA: Diagnosis not present

## 2019-07-06 DIAGNOSIS — E1169 Type 2 diabetes mellitus with other specified complication: Secondary | ICD-10-CM | POA: Diagnosis not present

## 2019-07-09 DIAGNOSIS — M5136 Other intervertebral disc degeneration, lumbar region: Secondary | ICD-10-CM | POA: Diagnosis not present

## 2019-07-09 DIAGNOSIS — E1159 Type 2 diabetes mellitus with other circulatory complications: Secondary | ICD-10-CM | POA: Diagnosis not present

## 2019-07-09 DIAGNOSIS — I152 Hypertension secondary to endocrine disorders: Secondary | ICD-10-CM | POA: Diagnosis not present

## 2019-07-13 DIAGNOSIS — E1169 Type 2 diabetes mellitus with other specified complication: Secondary | ICD-10-CM | POA: Diagnosis not present

## 2019-07-13 DIAGNOSIS — E1159 Type 2 diabetes mellitus with other circulatory complications: Secondary | ICD-10-CM | POA: Diagnosis not present

## 2019-07-13 DIAGNOSIS — E663 Overweight: Secondary | ICD-10-CM | POA: Diagnosis not present

## 2019-07-13 DIAGNOSIS — E785 Hyperlipidemia, unspecified: Secondary | ICD-10-CM | POA: Diagnosis not present

## 2019-07-14 DIAGNOSIS — I152 Hypertension secondary to endocrine disorders: Secondary | ICD-10-CM | POA: Diagnosis not present

## 2019-07-14 DIAGNOSIS — E1169 Type 2 diabetes mellitus with other specified complication: Secondary | ICD-10-CM | POA: Diagnosis not present

## 2019-07-14 DIAGNOSIS — E785 Hyperlipidemia, unspecified: Secondary | ICD-10-CM | POA: Diagnosis not present

## 2019-07-14 DIAGNOSIS — E1159 Type 2 diabetes mellitus with other circulatory complications: Secondary | ICD-10-CM | POA: Diagnosis not present

## 2019-07-20 ENCOUNTER — Encounter: Payer: Self-pay | Admitting: Physical Medicine & Rehabilitation

## 2019-07-20 ENCOUNTER — Other Ambulatory Visit: Payer: Self-pay

## 2019-07-20 ENCOUNTER — Encounter: Payer: PPO | Attending: Physical Medicine & Rehabilitation | Admitting: Physical Medicine & Rehabilitation

## 2019-07-20 VITALS — BP 111/74 | HR 65 | Temp 98.5°F | Ht 59.25 in | Wt 142.0 lb

## 2019-07-20 DIAGNOSIS — M25512 Pain in left shoulder: Secondary | ICD-10-CM | POA: Insufficient documentation

## 2019-07-20 DIAGNOSIS — M25511 Pain in right shoulder: Secondary | ICD-10-CM | POA: Insufficient documentation

## 2019-07-20 DIAGNOSIS — G8929 Other chronic pain: Secondary | ICD-10-CM | POA: Diagnosis not present

## 2019-07-20 NOTE — Patient Instructions (Signed)
Shoulder Impingement Syndrome Rehab Ask your health care provider which exercises are safe for you. Do exercises exactly as told by your health care provider and adjust them as directed. It is normal to feel mild stretching, pulling, tightness, or discomfort as you do these exercises. Stop right away if you feel sudden pain or your pain gets worse. Do not begin these exercises until told by your health care provider. Stretching and range-of-motion exercise This exercise warms up your muscles and joints and improves the movement and flexibility of your shoulder. This exercise also helps to relieve pain and stiffness. Passive horizontal adduction In passive adduction, you use your other hand to move the injured arm toward your body. The injured arm does not move on its own. In this movement, your arm is moved across your body in the horizontal plane (horizontal adduction). 1. Sit or stand and pull your left / right elbow across your chest, toward your other shoulder. Stop when you feel a gentle stretch in the back of your shoulder and upper arm. ? Keep your arm at shoulder height. ? Keep your arm as close to your body as you comfortably can. 2. Hold for __________ seconds. 3. Slowly return to the starting position. Repeat __________ times. Complete this exercise __________ times a day. Strengthening exercises These exercises build strength and endurance in your shoulder. Endurance is the ability to use your muscles for a long time, even after they get tired. External rotation, isometric This is an exercise in which you press the back of your wrist against a door frame without moving your shoulder joint (isometric). 1. Stand or sit in a doorway, facing the door frame. 2. Bend your left / right elbow and place the back of your wrist against the door frame. Only the back of your wrist should be touching the frame. Keep your upper arm at your side. 3. Gently press your wrist against the door frame, as if  you are trying to push your arm away from your abdomen (external rotation). Press as hard as you are able without pain. ? Avoid shrugging your shoulder while you press your wrist against the door frame. Keep your shoulder blade tucked down toward the middle of your back. 4. Hold for __________ seconds. 5. Slowly release the tension, and relax your muscles completely before you repeat the exercise. Repeat __________ times. Complete this exercise __________ times a day. Internal rotation, isometric This is an exercise in which you press your palm against a door frame without moving your shoulder joint (isometric). 1. Stand or sit in a doorway, facing the door frame. 2. Bend your left / right elbow and place the palm of your hand against the door frame. Only your palm should be touching the frame. Keep your upper arm at your side. 3. Gently press your hand against the door frame, as if you are trying to push your arm toward your abdomen (internal rotation). Press as hard as you are able without pain. ? Avoid shrugging your shoulder while you press your hand against the door frame. Keep your shoulder blade tucked down toward the middle of your back. 4. Hold for __________ seconds. 5. Slowly release the tension, and relax your muscles completely before you repeat the exercise. Repeat __________ times. Complete this exercise __________ times a day. Scapular protraction, supine  1. Lie on your back on a firm surface (supine position). Hold a __________ weight in your left / right hand. 2. Raise your left / right arm straight   into the air so your hand is directly above your shoulder joint. 3. Push the weight into the air so your shoulder (scapula) lifts off the surface that you are lying on. The scapula will push up or forward (protraction). Do not move your head, neck, or back. 4. Hold for __________ seconds. 5. Slowly return to the starting position. Let your muscles relax completely before you repeat  this exercise. Repeat __________ times. Complete this exercise __________ times a day. Scapular retraction  1. Sit in a stable chair without armrests, or stand up. 2. Secure an exercise band to a stable object in front of you so the band is at shoulder height. 3. Hold one end of the exercise band in each hand. Your palms should face down. 4. Squeeze your shoulder blades together (retraction) and move your elbows slightly behind you. Do not shrug your shoulders upward while you do this. 5. Hold for __________ seconds. 6. Slowly return to the starting position. Repeat __________ times. Complete this exercise __________ times a day. Shoulder extension  1. Sit in a stable chair without armrests, or stand up. 2. Secure an exercise band to a stable object in front of you so the band is above shoulder height. 3. Hold one end of the exercise band in each hand. 4. Straighten your elbows and lift your hands up to shoulder height. 5. Squeeze your shoulder blades together and pull your hands down to the sides of your thighs (extension). Stop when your hands are straight down by your sides. Do not let your hands go behind your body. 6. Hold for __________ seconds. 7. Slowly return to the starting position. Repeat __________ times. Complete this exercise __________ times a day. This information is not intended to replace advice given to you by your health care provider. Make sure you discuss any questions you have with your health care provider. Document Revised: 08/19/2018 Document Reviewed: 05/23/2018 Elsevier Patient Education  2020 [employer]  
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
## 2019-07-26 DIAGNOSIS — M65331 Trigger finger, right middle finger: Secondary | ICD-10-CM | POA: Diagnosis not present

## 2019-07-27 DIAGNOSIS — E663 Overweight: Secondary | ICD-10-CM | POA: Diagnosis not present

## 2019-07-28 ENCOUNTER — Other Ambulatory Visit: Payer: Self-pay | Admitting: Orthopedic Surgery

## 2019-08-09 DIAGNOSIS — I152 Hypertension secondary to endocrine disorders: Secondary | ICD-10-CM | POA: Diagnosis not present

## 2019-08-09 DIAGNOSIS — E785 Hyperlipidemia, unspecified: Secondary | ICD-10-CM | POA: Diagnosis not present

## 2019-08-09 DIAGNOSIS — E1159 Type 2 diabetes mellitus with other circulatory complications: Secondary | ICD-10-CM | POA: Diagnosis not present

## 2019-08-09 DIAGNOSIS — E1169 Type 2 diabetes mellitus with other specified complication: Secondary | ICD-10-CM | POA: Diagnosis not present

## 2019-08-10 DIAGNOSIS — Z6828 Body mass index (BMI) 28.0-28.9, adult: Secondary | ICD-10-CM | POA: Diagnosis not present

## 2019-08-10 DIAGNOSIS — E663 Overweight: Secondary | ICD-10-CM | POA: Diagnosis not present

## 2019-08-16 ENCOUNTER — Encounter (HOSPITAL_BASED_OUTPATIENT_CLINIC_OR_DEPARTMENT_OTHER): Payer: Self-pay | Admitting: Orthopedic Surgery

## 2019-08-16 ENCOUNTER — Other Ambulatory Visit: Payer: Self-pay

## 2019-08-17 ENCOUNTER — Ambulatory Visit: Payer: PPO | Admitting: Physical Medicine & Rehabilitation

## 2019-08-21 ENCOUNTER — Other Ambulatory Visit (HOSPITAL_COMMUNITY)
Admission: RE | Admit: 2019-08-21 | Discharge: 2019-08-21 | Disposition: A | Discharge status: Home / Self Care | Payer: PPO | Source: Ambulatory Visit | Attending: Orthopedic Surgery | Admitting: Orthopedic Surgery

## 2019-08-21 ENCOUNTER — Encounter (HOSPITAL_BASED_OUTPATIENT_CLINIC_OR_DEPARTMENT_OTHER)
Admission: RE | Admit: 2019-08-21 | Discharge: 2019-08-21 | Disposition: A | Discharge status: Home / Self Care | Payer: PPO | Source: Ambulatory Visit | Attending: Orthopedic Surgery | Admitting: Orthopedic Surgery

## 2019-08-21 DIAGNOSIS — Z833 Family history of diabetes mellitus: Secondary | ICD-10-CM | POA: Diagnosis not present

## 2019-08-21 DIAGNOSIS — M65331 Trigger finger, right middle finger: Secondary | ICD-10-CM | POA: Diagnosis not present

## 2019-08-21 DIAGNOSIS — M797 Fibromyalgia: Secondary | ICD-10-CM | POA: Diagnosis not present

## 2019-08-21 DIAGNOSIS — I10 Essential (primary) hypertension: Secondary | ICD-10-CM | POA: Diagnosis not present

## 2019-08-21 DIAGNOSIS — Z20822 Contact with and (suspected) exposure to covid-19: Secondary | ICD-10-CM | POA: Insufficient documentation

## 2019-08-21 DIAGNOSIS — Z79899 Other long term (current) drug therapy: Secondary | ICD-10-CM | POA: Diagnosis not present

## 2019-08-21 DIAGNOSIS — Z8249 Family history of ischemic heart disease and other diseases of the circulatory system: Secondary | ICD-10-CM | POA: Diagnosis not present

## 2019-08-21 DIAGNOSIS — Z87891 Personal history of nicotine dependence: Secondary | ICD-10-CM | POA: Diagnosis not present

## 2019-08-21 DIAGNOSIS — Z794 Long term (current) use of insulin: Secondary | ICD-10-CM | POA: Diagnosis not present

## 2019-08-21 DIAGNOSIS — Z01812 Encounter for preprocedural laboratory examination: Secondary | ICD-10-CM | POA: Diagnosis not present

## 2019-08-21 DIAGNOSIS — Z888 Allergy status to other drugs, medicaments and biological substances status: Secondary | ICD-10-CM | POA: Diagnosis not present

## 2019-08-21 DIAGNOSIS — F329 Major depressive disorder, single episode, unspecified: Secondary | ICD-10-CM | POA: Diagnosis not present

## 2019-08-21 DIAGNOSIS — Z885 Allergy status to narcotic agent status: Secondary | ICD-10-CM | POA: Diagnosis not present

## 2019-08-21 DIAGNOSIS — E1136 Type 2 diabetes mellitus with diabetic cataract: Secondary | ICD-10-CM | POA: Diagnosis not present

## 2019-08-21 DIAGNOSIS — F419 Anxiety disorder, unspecified: Secondary | ICD-10-CM | POA: Diagnosis not present

## 2019-08-21 DIAGNOSIS — Z7982 Long term (current) use of aspirin: Secondary | ICD-10-CM | POA: Diagnosis not present

## 2019-08-21 LAB — BASIC METABOLIC PANEL
Anion gap: 8 (ref 5–15)
BUN: 21 mg/dL — ABNORMAL HIGH (ref 6–20)
CO2: 27 mmol/L (ref 22–32)
Calcium: 9.3 mg/dL (ref 8.9–10.3)
Chloride: 103 mmol/L (ref 98–111)
Creatinine, Ser: 0.86 mg/dL (ref 0.44–1.00)
GFR calc Af Amer: >60 mL/min (ref >60)
GFR calc non Af Amer: >60 mL/min (ref >60)
Glucose, Bld: 90 mg/dL (ref 70–99)
Potassium: 5.3 mmol/L — ABNORMAL HIGH (ref 3.5–5.1)
Sodium: 138 mmol/L (ref 135–145)

## 2019-08-21 LAB — SARS CORONAVIRUS 2 (TAT 6-24 HRS): SARS Coronavirus 2: NEGATIVE

## 2019-08-21 NOTE — Progress Notes (Signed)

## 2019-08-21 NOTE — Progress Notes (Signed)
K+5.3, Dr. Salvadore Farber aware.

## 2019-08-24 ENCOUNTER — Ambulatory Visit (HOSPITAL_BASED_OUTPATIENT_CLINIC_OR_DEPARTMENT_OTHER)
Admission: RE | Admit: 2019-08-24 | Discharge: 2019-08-24 | Disposition: A | Discharge status: Home / Self Care | Payer: PPO | Attending: Orthopedic Surgery | Admitting: Orthopedic Surgery

## 2019-08-24 ENCOUNTER — Other Ambulatory Visit: Payer: Self-pay

## 2019-08-24 ENCOUNTER — Ambulatory Visit (HOSPITAL_BASED_OUTPATIENT_CLINIC_OR_DEPARTMENT_OTHER): Payer: PPO | Admitting: Anesthesiology

## 2019-08-24 ENCOUNTER — Encounter (HOSPITAL_BASED_OUTPATIENT_CLINIC_OR_DEPARTMENT_OTHER)
Admission: RE | Disposition: A | Discharge status: Home / Self Care | Payer: Self-pay | Source: Home / Self Care | Attending: Orthopedic Surgery

## 2019-08-24 ENCOUNTER — Encounter (HOSPITAL_BASED_OUTPATIENT_CLINIC_OR_DEPARTMENT_OTHER): Payer: Self-pay | Admitting: Orthopedic Surgery

## 2019-08-24 DIAGNOSIS — Z7982 Long term (current) use of aspirin: Secondary | ICD-10-CM | POA: Insufficient documentation

## 2019-08-24 DIAGNOSIS — Z888 Allergy status to other drugs, medicaments and biological substances status: Secondary | ICD-10-CM | POA: Insufficient documentation

## 2019-08-24 DIAGNOSIS — F419 Anxiety disorder, unspecified: Secondary | ICD-10-CM | POA: Insufficient documentation

## 2019-08-24 DIAGNOSIS — F418 Other specified anxiety disorders: Secondary | ICD-10-CM | POA: Diagnosis not present

## 2019-08-24 DIAGNOSIS — E1136 Type 2 diabetes mellitus with diabetic cataract: Secondary | ICD-10-CM | POA: Insufficient documentation

## 2019-08-24 DIAGNOSIS — I10 Essential (primary) hypertension: Secondary | ICD-10-CM | POA: Insufficient documentation

## 2019-08-24 DIAGNOSIS — M797 Fibromyalgia: Secondary | ICD-10-CM | POA: Diagnosis not present

## 2019-08-24 DIAGNOSIS — E119 Type 2 diabetes mellitus without complications: Secondary | ICD-10-CM | POA: Diagnosis not present

## 2019-08-24 DIAGNOSIS — F329 Major depressive disorder, single episode, unspecified: Secondary | ICD-10-CM | POA: Diagnosis not present

## 2019-08-24 DIAGNOSIS — Z87891 Personal history of nicotine dependence: Secondary | ICD-10-CM | POA: Insufficient documentation

## 2019-08-24 DIAGNOSIS — M65331 Trigger finger, right middle finger: Secondary | ICD-10-CM | POA: Insufficient documentation

## 2019-08-24 DIAGNOSIS — Z885 Allergy status to narcotic agent status: Secondary | ICD-10-CM | POA: Insufficient documentation

## 2019-08-24 DIAGNOSIS — Z79899 Other long term (current) drug therapy: Secondary | ICD-10-CM | POA: Diagnosis not present

## 2019-08-24 DIAGNOSIS — Z794 Long term (current) use of insulin: Secondary | ICD-10-CM | POA: Diagnosis not present

## 2019-08-24 DIAGNOSIS — Z8249 Family history of ischemic heart disease and other diseases of the circulatory system: Secondary | ICD-10-CM | POA: Insufficient documentation

## 2019-08-24 DIAGNOSIS — Z833 Family history of diabetes mellitus: Secondary | ICD-10-CM | POA: Insufficient documentation

## 2019-08-24 HISTORY — PX: TRIGGER FINGER RELEASE: SHX641

## 2019-08-24 HISTORY — DX: Fibromyalgia: M79.7

## 2019-08-24 LAB — GLUCOSE, CAPILLARY
Glucose-Capillary: 82 mg/dL (ref 70–99)
Glucose-Capillary: 80 mg/dL (ref 70–99)

## 2019-08-24 SURGERY — RELEASE, A1 PULLEY, FOR TRIGGER FINGER
Anesthesia: Regional | Site: Hand | Laterality: Right

## 2019-08-24 MED ORDER — LACTATED RINGERS IV SOLN: INTRAVENOUS | Status: DC

## 2019-08-24 MED ORDER — PROMETHAZINE HCL 25 MG/ML IJ SOLN: 6.2500 mg | INTRAMUSCULAR | Status: DC | PRN

## 2019-08-24 MED ORDER — CEFAZOLIN SODIUM-DEXTROSE 2-4 GM/100ML-% IV SOLN
INTRAVENOUS | Status: AC
  Filled 2019-08-24: qty 100

## 2019-08-24 MED ORDER — PROPOFOL 500 MG/50ML IV EMUL
INTRAVENOUS | Status: DC | PRN
  Administered 2019-08-24: 50 ug/kg/min via INTRAVENOUS

## 2019-08-24 MED ORDER — LIDOCAINE HCL (PF) 0.5 % IJ SOLN
INTRAMUSCULAR | Status: DC | PRN
  Administered 2019-08-24: 32 mL via INTRAVENOUS

## 2019-08-24 MED ORDER — OXYCODONE HCL 5 MG/5ML PO SOLN: 5.0000 mg | Freq: Once | ORAL | Status: DC | PRN

## 2019-08-24 MED ORDER — HYDROCODONE-ACETAMINOPHEN 5-325 MG PO TABS: ORAL_TABLET | ORAL | 0 refills | Status: DC

## 2019-08-24 MED ORDER — BUPIVACAINE HCL (PF) 0.25 % IJ SOLN
INTRAMUSCULAR | Status: DC | PRN
  Administered 2019-08-24: 5 mL

## 2019-08-24 MED ORDER — OXYCODONE HCL 5 MG PO TABS: 5.0000 mg | ORAL_TABLET | Freq: Once | ORAL | Status: DC | PRN

## 2019-08-24 MED ORDER — CHLORHEXIDINE GLUCONATE 4 % EX LIQD: 60.0000 mL | Freq: Once | CUTANEOUS | Status: DC

## 2019-08-24 MED ORDER — MIDAZOLAM HCL 5 MG/5ML IJ SOLN
INTRAMUSCULAR | Status: DC | PRN
  Administered 2019-08-24: 2 mg via INTRAVENOUS

## 2019-08-24 MED ORDER — FENTANYL CITRATE (PF) 100 MCG/2ML IJ SOLN
INTRAMUSCULAR | Status: AC
  Filled 2019-08-24: qty 2

## 2019-08-24 MED ORDER — FENTANYL CITRATE (PF) 100 MCG/2ML IJ SOLN
INTRAMUSCULAR | Status: DC | PRN
  Administered 2019-08-24 (×2): 50 ug via INTRAVENOUS

## 2019-08-24 MED ORDER — HYDROMORPHONE HCL 1 MG/ML IJ SOLN: 0.2500 mg | INTRAMUSCULAR | Status: DC | PRN

## 2019-08-24 MED ORDER — CEFAZOLIN SODIUM-DEXTROSE 2-4 GM/100ML-% IV SOLN
2.0000 g | INTRAVENOUS | Status: AC
  Administered 2019-08-24: 2 g via INTRAVENOUS

## 2019-08-24 MED ORDER — MIDAZOLAM HCL 2 MG/2ML IJ SOLN
INTRAMUSCULAR | Status: AC
  Filled 2019-08-24: qty 2

## 2019-08-24 SURGICAL SUPPLY — 36 items
BLADE SURG 15 STRL LF DISP TIS (BLADE) ×2 IMPLANT
BLADE SURG 15 STRL SS (BLADE) ×2
BNDG COHESIVE 2X5 TAN STRL LF (GAUZE/BANDAGES/DRESSINGS) ×2 IMPLANT
BNDG ESMARK 4X9 LF (GAUZE/BANDAGES/DRESSINGS) ×1 IMPLANT
CHLORAPREP W/TINT 26 (MISCELLANEOUS) ×2 IMPLANT
CORD BIPOLAR FORCEPS 12FT (ELECTRODE) ×2 IMPLANT
COVER BACK TABLE 60X90IN (DRAPES) ×2 IMPLANT
COVER MAYO STAND STRL (DRAPES) ×2 IMPLANT
COVER WAND RF STERILE (DRAPES) IMPLANT
CUFF TOURN SGL QUICK 18X4 (TOURNIQUET CUFF) ×2 IMPLANT
DRAPE EXTREMITY T 121X128X90 (DISPOSABLE) ×2 IMPLANT
DRAPE SURG 17X23 STRL (DRAPES) ×2 IMPLANT
GAUZE SPONGE 4X4 12PLY STRL (GAUZE/BANDAGES/DRESSINGS) ×2 IMPLANT
GAUZE XEROFORM 1X8 LF (GAUZE/BANDAGES/DRESSINGS) ×2 IMPLANT
GLOVE BIO SURGEON STRL SZ7.5 (GLOVE) ×2 IMPLANT
GLOVE BIOGEL PI IND STRL 6.5 (GLOVE) IMPLANT
GLOVE BIOGEL PI IND STRL 7.5 (GLOVE) IMPLANT
GLOVE BIOGEL PI IND STRL 8 (GLOVE) ×1 IMPLANT
GLOVE BIOGEL PI INDICATOR 6.5 (GLOVE) ×1
GLOVE BIOGEL PI INDICATOR 7.5 (GLOVE) ×1
GLOVE BIOGEL PI INDICATOR 8 (GLOVE) ×2
GLOVE ECLIPSE 8.0 STRL XLNG CF (GLOVE) ×1 IMPLANT
GLOVE SURG SS PI 7.0 STRL IVOR (GLOVE) ×1 IMPLANT
GOWN STRL REUS W/ TWL LRG LVL3 (GOWN DISPOSABLE) ×1 IMPLANT
GOWN STRL REUS W/TWL LRG LVL3 (GOWN DISPOSABLE) ×1
GOWN STRL REUS W/TWL XL LVL3 (GOWN DISPOSABLE) ×3 IMPLANT
NDL HYPO 25X1 1.5 SAFETY (NEEDLE) ×1 IMPLANT
NEEDLE HYPO 25X1 1.5 SAFETY (NEEDLE) ×2 IMPLANT
NS IRRIG 1000ML POUR BTL (IV SOLUTION) ×2 IMPLANT
PACK BASIN DAY SURGERY FS (CUSTOM PROCEDURE TRAY) ×2 IMPLANT
STOCKINETTE 4X48 STRL (DRAPES) ×2 IMPLANT
SUT ETHILON 4 0 PS 2 18 (SUTURE) ×2 IMPLANT
SYR BULB 3OZ (MISCELLANEOUS) ×2 IMPLANT
SYR CONTROL 10ML LL (SYRINGE) ×2 IMPLANT
TOWEL GREEN STERILE FF (TOWEL DISPOSABLE) ×4 IMPLANT
UNDERPAD 30X36 HEAVY ABSORB (UNDERPADS AND DIAPERS) ×2 IMPLANT

## 2019-08-24 NOTE — H&P (Signed)
Toni Chambers is an 59 y.o. female.   Chief Complaint: right middle finger trigger digit HPI: 59 yo female with triggering right middle finger.  This has been injected without lasting resolution.  She wishes to have a trigger release.  Allergies:  Allergies  Allergen Reactions  . Codeine Nausea And Vomiting    Feels like sea sickness  . Wellbutrin [Bupropion] Anxiety    Hallucinations, uncontrolled anxiety   . Gabapentin Other (See Comments)    Made patient fall  . Tramadol Other (See Comments)    Headache  . Trazodone And Nefazodone Other (See Comments)    Headache  . Elavil [Amitriptyline] Other (See Comments)    Headache    Past Medical History:  Diagnosis Date  . Anxiety   . Cataract   . Depression   . Diabetes mellitus without complication (HCC)   . Fibromyalgia   . Hypertension   . Neuromuscular disorder [facility])    Fibromyalgia    Past Surgical History:  Procedure Laterality Date  . BILATERAL CARPAL TUNNEL RELEASE    . CHOLECYSTECTOMY    . FRACTURE SURGERY     humerus  . HERNIA REPAIR    . TONSILLECTOMY  1970    Family History: Family History  Problem Relation Age of Onset  . Hypertension Mother   . Cancer Mother   . Diabetes Mother   . Heart disease Father   . Cancer Father   . Depression Father   . Hypertension Father   . Diabetes Sister     Social History:   reports that she quit smoking about 30 years ago. Her smoking use included cigarettes. She has never used smokeless tobacco. She reports previous alcohol use. She reports that she does not use drugs.  Medications: Medications Prior to Admission  Medication Sig Dispense Refill  . acetaminophen (TYLENOL) 500 MG tablet Take 500 mg by mouth every 6 (six) hours as needed.    Marland Kitchen aspirin EC 81 MG tablet Take 81 mg by mouth daily.    . Dulaglutide (TRULICITY) 1.5 MG/0.5ML SOPN Inject 1.5 mg into the skin once a week.    . DULoxetine (CYMBALTA) 20 MG capsule Take 20 mg by mouth daily.    Marland Kitchen  glipiZIDE (GLUCOTROL XL) 2.5 MG 24 hr tablet Take 2.5 mg by mouth daily with breakfast.    . Icosapent Ethyl (VASCEPA PO) Take by mouth.    . insulin detemir (LEVEMIR) 100 UNIT/ML injection Inject 35 Units into the skin daily.     Marland Kitchen lidocaine (LMX) 4 % cream Apply 1 application topically as needed.    Marland Kitchen lisinopril (ZESTRIL) 10 MG tablet Take 5 mg by mouth daily.     . naproxen sodium (ALEVE) 220 MG tablet Take 220 mg by mouth.    . nebivolol (BYSTOLIC) 5 MG tablet Take 5 mg by mouth daily.    . rosuvastatin (CRESTOR) 20 MG tablet Take 20 mg by mouth daily.    Marland Kitchen trolamine salicylate (ASPERCREME) 10 % cream Apply 1 application topically as needed for muscle pain.    . ergocalciferol (VITAMIN D2) 1.25 MG (50000 UT) capsule Take 50,000 Units by mouth once a week.      No results found for this or any previous visit (from the past 48 hour(s)).  No results found.   A comprehensive review of systems was negative.  Blood pressure 116/69, pulse (!) 59, temperature 97.9 F (36.6 C), temperature source Oral, resp. rate 16, height 4' 11.5" (1.511 m), weight  63 kg, SpO2 100 %.  General appearance: alert, cooperative and appears stated age Head: Normocephalic, without obvious abnormality, atraumatic Neck: supple, symmetrical, trachea midline Cardio: regular rate and rhythm Resp: clear to auscultation bilaterally Extremities: Intact sensation and capillary refill all digits.  +epl/fpl/io.  No wounds.  Pulses: 2+ and symmetric Skin: Skin color, texture, turgor normal. No rashes or lesions Neurologic: Grossly normal Incision/Wound: none  Assessment/Plan Right middle finger trigger digit.  Non operative and operative treatment options have been discussed with the patient and patient wishes to proceed with operative treatment. Risks, benefits, and alternatives of surgery have been discussed and the patient agrees with the plan of care.   Leanora Cover 08/24/2019, 8:14 AM

## 2019-08-24 NOTE — Transfer of Care (Signed)
Immediate Anesthesia Transfer of Care Note  Patient: Toni Chambers  Procedure(s) Performed: RELEASE TRIGGER FINGER/A-1 PULLEY (Right Hand)  Patient Location: PACU  Anesthesia Type:MAC  Level of Consciousness: awake and alert   Airway & Oxygen Therapy: Patient Spontanous Breathing and Patient connected to face mask oxygen  Post-op Assessment: Report given to RN and Post -op Vital signs reviewed and stable  Post vital signs: Reviewed and stable  Last Vitals:  Vitals Value Taken Time  BP    Temp    Pulse 66 08/24/19 1054  Resp 12 08/24/19 1054  SpO2 100 % 08/24/19 1054  Vitals shown include unvalidated device data.  Last Pain:  Vitals:   08/24/19 0810  TempSrc: Oral  PainSc: 2       Patients Stated Pain Goal: 5 (41/96/22 2979)  Complications: No apparent anesthesia complications

## 2019-08-24 NOTE — Anesthesia Procedure Notes (Signed)
Anesthesia Regional Block: Bier block (IV Regional)   Pre-Anesthetic Checklist: ,, timeout performed, Correct Patient, Correct Site, Correct Laterality, Correct Procedure,, site marked, surgical consent,, at surgeon's request  Laterality: Right  Prep: chloraprep       Needles:  Injection technique: Single-shot  Needle Type: Other      Needle Gauge: 20     Additional Needles:   Procedures:,,,,, intact distal pulses, Esmarch exsanguination, single tourniquet utilized,  Narrative:  Start time: 08/24/2019 10:28 AM End time: 08/24/2019 10:29 AM Injection made incrementally with aspirations every 32 mL.  Performed by: Personally

## 2019-08-24 NOTE — Discharge Instructions (Addendum)

## 2019-08-24 NOTE — Op Note (Signed)
08/24/2019 [facility]  Operative Note  PREOPERATIVE DIAGNOSIS: RIGHT MIDDLE FINGER TRIGGER  POSTOPERATIVE DIAGNOSIS:  RIGHT MIDDLE FINGER TRIGGER  PROCEDURE: Procedure(s): RELEASE TRIGGER FINGER/A-1 PULLEY  SURGEON:  Betha Loa, MD  ASSISTANT:  none.  ANESTHESIA:  Bier block with sedation.  IV FLUIDS:  Per anesthesia flow sheet.  ESTIMATED BLOOD LOSS:  Minimal.  COMPLICATIONS:  None.  SPECIMENS:  None.  TOURNIQUET TIME:  Total Tourniquet Time Documented: Forearm (Right) - 20 minutes Total: Forearm (Right) - 20 minutes   DISPOSITION:  Stable to PACU.  LOCATION: [facility]  INDICATIONS: Toni Chambers is a 59 y.o. female with triggering right long finger.  This has been injected without lasting resolution.  She wishes to have a release of the right long finger trigger digit.  Risks, benefits and alternatives of surgery were discussed including the risk of blood loss, infection, damage to nerves, vessels, tendons, ligaments, bone, failure of surgery, need for additional surgery, complications with wound healing, continued pain, continued triggering and need for repeat surgery.  She voiced understanding of these risks and elected to proceed.  OPERATIVE COURSE:  After being identified preoperatively by myself, the patient and I agreed upon the procedure and site of procedure.  The surgical site was marked. The risks, benefits, and alternatives of surgery were reviewed and she wished to proceed.  Surgical consent had been signed. She was given IV Ancef as preoperative antibiotic prophylaxis. She was transported to the operating room and placed on the operating room table in supine position with the Right upper extremity on an arm board. Bier block anesthesia was induced by the anesthesiologist.  The Right upper extremity was prepped and draped in normal sterile orthopedic fashion. A surgical pause was performed between surgeons, anesthesia, and operating  room staff, and all were in agreement as to the patient, procedure, and site of procedure.  Tourniquet at the proximal aspect of the forearm had been inflated for the Bier block.  An incision was made at the volar aspect of the MP joint of the long finger.  This was carried into the subcutaneous tissues by preading technique.  Bipolar electrocautery was used to obtain hemostasis.  The radial and ulnar digital nerves were protected throughout the case. The flexor sheath was identified.  The A1 pulley was identified and sharply incised.  It was released in its entirety.  The proximal 1-2 mm of the A2 pulley was vented to allow better excursion of the tendons.  The finger was placed through a range of motion and there was noted to be no catching.  The tendons were brought through the wound and any adherences released.  The wound was then copiously irrigated with sterile saline. It was closed with 4-0 nylon in a horizontal mattress fashion.  It was injected with 0.25% plain Marcaine to aid in postoperative analgesia.  It was dressed with sterile Xeroform, 4x4s, and wrapped lightly with a Coban dressing.  Tourniquet was deflated at 20 minutes.  The fingertips were pink with brisk capillary refill after deflation of the tourniquet.  The operative drapes were broken down and the patient was awoken from anesthesia safely.  She was transferred back to the stretcher and taken to the PACU in stable condition.   I will see her back in the office in 1 week for postoperative followup.  I will give her a prescription for Norco 5/325 1-2 tabs PO q6 hours prn pain, dispense # 20 which she states she can take.  Leanora Cover, MD Electronically signed, 08/24/19

## 2019-08-24 NOTE — Anesthesia Postprocedure Evaluation (Signed)
Anesthesia Post Note  Patient: Toni Chambers  Procedure(s) Performed: RELEASE TRIGGER FINGER/A-1 PULLEY (Right Hand)     Patient location during evaluation: PACU Anesthesia Type: Bier Block Level of consciousness: awake and alert Pain management: pain level controlled Vital Signs Assessment: post-procedure vital signs reviewed and stable Respiratory status: spontaneous breathing, nonlabored ventilation and respiratory function stable Cardiovascular status: blood pressure returned to baseline and stable Postop Assessment: no apparent nausea or vomiting Anesthetic complications: no    Last Vitals:  Vitals:   08/24/19 1115 08/24/19 1145  BP: 96/61 (!) 99/59  Pulse: 60 (!) 56  Resp: (!) 21 18  Temp:  36.8 C  SpO2: 98% 99%    Last Pain:  Vitals:   08/24/19 1130  TempSrc:   PainSc: 0-No pain                 Lowella Curb

## 2019-08-24 NOTE — Anesthesia Preprocedure Evaluation (Signed)
Anesthesia Evaluation  Patient identified by MRN, date of birth, ID band Patient awake    Reviewed: Allergy & Precautions, NPO status , Patient's Chart, lab work & pertinent test results  Airway Mallampati: II  TM Distance: >3 FB Neck ROM: Full    Dental no notable dental hx.    Pulmonary neg pulmonary ROS, former smoker,    Pulmonary exam normal breath sounds clear to auscultation       Cardiovascular hypertension, Pt. on medications negative cardio ROS Normal cardiovascular exam Rhythm:Regular Rate:Normal     Neuro/Psych Anxiety Depression negative neurological ROS  negative psych ROS   GI/Hepatic negative GI ROS, Neg liver ROS,   Endo/Other  negative endocrine ROSdiabetes  Renal/GU negative Renal ROS  negative genitourinary   Musculoskeletal  (+) Fibromyalgia -  Abdominal   Peds negative pediatric ROS (+)  Hematology negative hematology ROS (+)   Anesthesia Other Findings   Reproductive/Obstetrics negative OB ROS                             Anesthesia Physical Anesthesia Plan  ASA: III  Anesthesia Plan: Bier Block and Bier Block-LIDOCAINE ONLY   Post-op Pain Management:    Induction: Intravenous  PONV Risk Score and Plan: 2 and Ondansetron, Midazolam and Treatment may vary due to age or medical condition  Airway Management Planned: Simple Face Mask  Additional Equipment:   Intra-op Plan:   Post-operative Plan:   Informed Consent: I have reviewed the patients History and Physical, chart, labs and discussed the procedure including the risks, benefits and alternatives for the proposed anesthesia with the patient or authorized representative who has indicated his/her understanding and acceptance.     Dental advisory given  Plan Discussed with: CRNA  Anesthesia Plan Comments:         Anesthesia Quick Evaluation

## 2019-08-25 ENCOUNTER — Encounter: Payer: Self-pay | Admitting: *Deleted

## 2019-09-01 ENCOUNTER — Other Ambulatory Visit: Payer: Self-pay

## 2019-09-01 ENCOUNTER — Encounter: Payer: PPO | Attending: Physical Medicine & Rehabilitation | Admitting: Physical Medicine & Rehabilitation

## 2019-09-01 ENCOUNTER — Encounter: Payer: Self-pay | Admitting: Physical Medicine & Rehabilitation

## 2019-09-01 VITALS — BP 101/57 | HR 64 | Temp 97.7°F | Ht 59.5 in | Wt 137.0 lb

## 2019-09-01 DIAGNOSIS — M25511 Pain in right shoulder: Secondary | ICD-10-CM | POA: Diagnosis not present

## 2019-09-01 DIAGNOSIS — M25512 Pain in left shoulder: Secondary | ICD-10-CM

## 2019-09-01 DIAGNOSIS — G8929 Other chronic pain: Secondary | ICD-10-CM | POA: Diagnosis not present
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
## 2019-09-01 NOTE — Patient Instructions (Signed)
Will order MRI of RIght shoulder to evaluate for rotator cuff tear

## 2019-09-07 DIAGNOSIS — E1169 Type 2 diabetes mellitus with other specified complication: Secondary | ICD-10-CM | POA: Diagnosis not present

## 2019-09-07 DIAGNOSIS — E785 Hyperlipidemia, unspecified: Secondary | ICD-10-CM | POA: Diagnosis not present

## 2019-09-08 DIAGNOSIS — I152 Hypertension secondary to endocrine disorders: Secondary | ICD-10-CM | POA: Diagnosis not present

## 2019-09-08 DIAGNOSIS — E1159 Type 2 diabetes mellitus with other circulatory complications: Secondary | ICD-10-CM | POA: Diagnosis not present

## 2019-09-08 DIAGNOSIS — E1169 Type 2 diabetes mellitus with other specified complication: Secondary | ICD-10-CM | POA: Diagnosis not present

## 2019-09-08 DIAGNOSIS — E785 Hyperlipidemia, unspecified: Secondary | ICD-10-CM | POA: Diagnosis not present

## 2019-09-11 ENCOUNTER — Telehealth: Payer: Self-pay

## 2019-09-11 ENCOUNTER — Telehealth: Payer: Self-pay | Admitting: Physical Medicine & Rehabilitation

## 2019-09-11 DIAGNOSIS — G8929 Other chronic pain: Secondary | ICD-10-CM

## 2019-09-11 DIAGNOSIS — M25512 Pain in left shoulder: Secondary | ICD-10-CM

## 2019-09-11 DIAGNOSIS — G894 Chronic pain syndrome: Secondary | ICD-10-CM

## 2019-09-11 DIAGNOSIS — M25511 Pain in right shoulder: Secondary | ICD-10-CM

## 2019-09-11 NOTE — Telephone Encounter (Signed)
Order placed

## 2019-09-12 ENCOUNTER — Ambulatory Visit
Admission: RE | Admit: 2019-09-12 | Discharge: 2019-09-12 | Disposition: A | Discharge status: Home / Self Care | Payer: PPO | Source: Ambulatory Visit | Attending: Physical Medicine & Rehabilitation | Admitting: Physical Medicine & Rehabilitation

## 2019-09-12 ENCOUNTER — Other Ambulatory Visit: Payer: Self-pay

## 2019-09-12 DIAGNOSIS — M25511 Pain in right shoulder: Secondary | ICD-10-CM

## 2019-09-12 DIAGNOSIS — M25512 Pain in left shoulder: Secondary | ICD-10-CM

## 2019-09-12 DIAGNOSIS — G8929 Other chronic pain: Secondary | ICD-10-CM

## 2019-09-12 DIAGNOSIS — M75121 Complete rotator cuff tear or rupture of right shoulder, not specified as traumatic: Secondary | ICD-10-CM | POA: Diagnosis not present

## 2019-09-12 DIAGNOSIS — G894 Chronic pain syndrome: Secondary | ICD-10-CM

## 2019-09-12 MED ORDER — IOPAMIDOL (ISOVUE-M 200) INJECTION 41%
12.0000 mL | Freq: Once | INTRAMUSCULAR | Status: AC
  Administered 2019-09-12: 12 mL via INTRA_ARTICULAR

## 2019-09-13 NOTE — Telephone Encounter (Signed)
Pt had questions about CT scan scheduling

## 2019-09-14 ENCOUNTER — Telehealth: Payer: Self-pay | Admitting: *Deleted

## 2019-09-14 NOTE — Telephone Encounter (Signed)
I gave Toni Chambers the results.  She has an appt on 09/29/19 and that is the earliest available appt. Not sure the injection would help. She really would like a call from Toni Chambers who can explain it more in detail about her questions involving the tears.  Her number is # [phone].

## 2019-09-14 NOTE — Telephone Encounter (Signed)
-----   Message from Erick Colace, MD sent at 09/14/2019 12:46 PM EDT ----- Main findings are supraspinatus tear I am not sure whether this is operable but would need orthopedic surgery to evaluate this.  The acromioclavicular joint arthritis also may be causing quite a bit of pain.  I can do an injection on this to see if this is helpful.  This would need to be done under ultrasound guidance.  Please see if patient would like to schedule the injection.

## 2019-09-15 NOTE — Telephone Encounter (Signed)
Please change next visit from follow-up to ultrasound-guided right acromioclavicular joint injection

## 2019-09-25 ENCOUNTER — Other Ambulatory Visit: Payer: PPO

## 2019-09-29 ENCOUNTER — Other Ambulatory Visit: Payer: Self-pay

## 2019-09-29 ENCOUNTER — Encounter: Payer: PPO | Attending: Physical Medicine & Rehabilitation | Admitting: Physical Medicine & Rehabilitation

## 2019-09-29 ENCOUNTER — Encounter: Payer: Self-pay | Admitting: Physical Medicine & Rehabilitation

## 2019-09-29 VITALS — BP 115/75 | HR 61 | Temp 97.7°F | Ht 59.5 in | Wt 132.0 lb

## 2019-09-29 DIAGNOSIS — M19019 Primary osteoarthritis, unspecified shoulder: Secondary | ICD-10-CM

## 2019-09-29 DIAGNOSIS — M75101 Unspecified rotator cuff tear or rupture of right shoulder, not specified as traumatic: Secondary | ICD-10-CM | POA: Diagnosis not present

## 2019-09-29 DIAGNOSIS — G8929 Other chronic pain: Secondary | ICD-10-CM | POA: Diagnosis not present

## 2019-09-29 DIAGNOSIS — M25512 Pain in left shoulder: Secondary | ICD-10-CM | POA: Diagnosis not present

## 2019-09-29 DIAGNOSIS — M25511 Pain in right shoulder: Secondary | ICD-10-CM | POA: Insufficient documentation

## 2019-09-29 NOTE — Progress Notes (Signed)
Right Acromioclavicular joint injection under ultrasound guidance  Indication is for anterior shoulder pain with imaging studies demonstrating acromioclavicular joint arthropathy Pain is only partially responsive to medication management and other conservative care. Pain interferes with ADLs and sleep  The patient was placed in a seated position the acromioclavicular joint was scanned in the long axis view, areas marked prepped with Betadine, sterile technique was utilized. Under direct long axis view 1% lidocaine was infiltrated to the skin and subcutaneous tissues. A 25-gauge 1.5 inch needle reached the acromioclavicular joint space. Then a solution containing 1 mL of lidocaine 1% and 0.5 ML of Celestone 6 mg per mL was injected. Patient tolerated procedure well. Images were saved. Postprocedure instructions given  As discussed this injection may help with some of pt's pain, but would certainly not help with pain related to her supraspinatus tear.  She will be referred to orthopedic surgery Dr. August Saucer to evaluate.

## 2019-09-29 NOTE — Patient Instructions (Signed)
Referral made to Ortho care, Dr. August Saucer to evaluate supraspinatus tear.

## 2019-10-09 DIAGNOSIS — E1169 Type 2 diabetes mellitus with other specified complication: Secondary | ICD-10-CM | POA: Diagnosis not present

## 2019-10-09 DIAGNOSIS — E785 Hyperlipidemia, unspecified: Secondary | ICD-10-CM | POA: Diagnosis not present

## 2019-10-09 DIAGNOSIS — E1159 Type 2 diabetes mellitus with other circulatory complications: Secondary | ICD-10-CM | POA: Diagnosis not present

## 2019-10-16 ENCOUNTER — Encounter: Payer: Self-pay | Admitting: Orthopedic Surgery

## 2019-10-16 ENCOUNTER — Ambulatory Visit: Payer: PPO | Admitting: Orthopedic Surgery

## 2019-10-16 ENCOUNTER — Other Ambulatory Visit: Payer: Self-pay

## 2019-10-16 VITALS — Ht 59.5 in | Wt 130.0 lb

## 2019-10-16 DIAGNOSIS — S46011A Strain of muscle(s) and tendon(s) of the rotator cuff of right shoulder, initial encounter: Secondary | ICD-10-CM

## 2019-10-17 ENCOUNTER — Telehealth: Payer: Self-pay | Admitting: Orthopedic Surgery

## 2019-10-17 NOTE — Telephone Encounter (Signed)
Patient would like [facility].  She has chosen a date of 11-20-19.  She would like to know if it is possible to request Dr. Hyacinth Meeker (the anesthesiologist at the [facility])  cb [phone]

## 2019-10-18 NOTE — Telephone Encounter (Signed)
I called patient and advised. Sending to you per Dr. Diamantina Providence message. I assume you need to call surgical center to make request.

## 2019-10-18 NOTE — Telephone Encounter (Signed)
Yes it is possible to request that anesthesiologist.  Toni Chambers will call her tomorrow.

## 2019-10-18 NOTE — Telephone Encounter (Signed)
Please advise. Thanks.  

## 2019-10-19 ENCOUNTER — Encounter: Payer: Self-pay | Admitting: Orthopedic Surgery
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
## 2019-10-23 ENCOUNTER — Telehealth: Payer: Self-pay | Admitting: Orthopedic Surgery

## 2019-10-23 NOTE — Telephone Encounter (Signed)
Pt called wanting to know if Dr.Dean operated on trigger fingers (Thumb on R Hand) and if so would he be able to operate on it while operating on her shoulder?  [phone]

## 2019-10-23 NOTE — Telephone Encounter (Signed)
Pls advise.  

## 2019-10-23 NOTE — Telephone Encounter (Signed)
Yes can do

## 2019-10-23 NOTE — Telephone Encounter (Signed)
Toni Chambers, I spoke with patient and advised per Dr August Saucer. Can you please make sure that it is posted for all of this?  Dr August Saucer, You have not seen patient for this particular trigger finger, but she states she knows what it is because she has had this same issue with other fingers in the past. Do you want to see her for this? Just started "triggering" in the last week.

## 2019-11-01 NOTE — Telephone Encounter (Signed)
Options for that trigger finger would be injection vs release at time of surgery could you pls put both on consent and I will talk with her about it the am of surgery thx

## 2019-11-01 NOTE — Telephone Encounter (Signed)
See below. I will give you back the blue sheet

## 2019-11-02 DIAGNOSIS — E1169 Type 2 diabetes mellitus with other specified complication: Secondary | ICD-10-CM | POA: Diagnosis not present

## 2019-11-08 DIAGNOSIS — I152 Hypertension secondary to endocrine disorders: Secondary | ICD-10-CM | POA: Diagnosis not present

## 2019-11-08 DIAGNOSIS — E1159 Type 2 diabetes mellitus with other circulatory complications: Secondary | ICD-10-CM | POA: Diagnosis not present

## 2019-11-08 DIAGNOSIS — E785 Hyperlipidemia, unspecified: Secondary | ICD-10-CM | POA: Diagnosis not present

## 2019-11-09 ENCOUNTER — Telehealth: Payer: Self-pay | Admitting: Orthopedic Surgery

## 2019-11-09 DIAGNOSIS — Z1331 Encounter for screening for depression: Secondary | ICD-10-CM | POA: Diagnosis not present

## 2019-11-09 DIAGNOSIS — Z0000 Encounter for general adult medical examination without abnormal findings: Secondary | ICD-10-CM | POA: Diagnosis not present

## 2019-11-09 DIAGNOSIS — Z139 Encounter for screening, unspecified: Secondary | ICD-10-CM | POA: Diagnosis not present

## 2019-11-09 DIAGNOSIS — E1159 Type 2 diabetes mellitus with other circulatory complications: Secondary | ICD-10-CM | POA: Diagnosis not present

## 2019-11-09 DIAGNOSIS — I152 Hypertension secondary to endocrine disorders: Secondary | ICD-10-CM | POA: Diagnosis not present

## 2019-11-09 DIAGNOSIS — E1169 Type 2 diabetes mellitus with other specified complication: Secondary | ICD-10-CM | POA: Diagnosis not present

## 2019-11-09 DIAGNOSIS — Z136 Encounter for screening for cardiovascular disorders: Secondary | ICD-10-CM | POA: Diagnosis not present

## 2019-11-09 DIAGNOSIS — Z1339 Encounter for screening examination for other mental health and behavioral disorders: Secondary | ICD-10-CM | POA: Diagnosis not present

## 2019-11-09 DIAGNOSIS — E785 Hyperlipidemia, unspecified: Secondary | ICD-10-CM | POA: Diagnosis not present

## 2019-11-09 NOTE — Telephone Encounter (Signed)
Patient would like to speak with doctor or assistant about recent medication changes.  Patient is scheduled for July 12th at [facility] 9:30am.  If possible try to reach her in the morning before 11am as she has a doctor's appointment.  cb [phone]  She is scheduled for the following procedure(s):   right shoulder arthroscopy, debridement, distal clavicle excision, biceps tenodesis, mini open rotator cuff tear repair, trigger thumb release right hand vs injection,  injection left shoulder [id number], [id number], [id number], [id number], [id number], 20600, 20610

## 2019-11-10 ENCOUNTER — Other Ambulatory Visit: Payer: Self-pay

## 2019-11-10 ENCOUNTER — Encounter: Payer: Self-pay | Admitting: Physical Medicine & Rehabilitation

## 2019-11-10 ENCOUNTER — Encounter: Payer: PPO | Attending: Physical Medicine & Rehabilitation | Admitting: Physical Medicine & Rehabilitation

## 2019-11-10 VITALS — BP 104/67 | HR 68 | Temp 98.6°F | Ht 59.5 in | Wt 129.4 lb

## 2019-11-10 DIAGNOSIS — G8929 Other chronic pain: Secondary | ICD-10-CM

## 2019-11-10 DIAGNOSIS — M7542 Impingement syndrome of left shoulder: Secondary | ICD-10-CM | POA: Diagnosis not present

## 2019-11-10 DIAGNOSIS — M75101 Unspecified rotator cuff tear or rupture of right shoulder, not specified as traumatic: Secondary | ICD-10-CM | POA: Insufficient documentation

## 2019-11-10 DIAGNOSIS — M25511 Pain in right shoulder: Secondary | ICD-10-CM | POA: Diagnosis not present

## 2019-11-10 DIAGNOSIS — M25512 Pain in left shoulder: Secondary | ICD-10-CM | POA: Diagnosis not present

## 2019-11-10 NOTE — Patient Instructions (Signed)
Shoulder Impingement Syndrome Rehab Ask your health care provider which exercises are safe for you. Do exercises exactly as told by your health care provider and adjust them as directed. It is normal to feel mild stretching, pulling, tightness, or discomfort as you do these exercises. Stop right away if you feel sudden pain or your pain gets worse. Do not begin these exercises until told by your health care provider. Stretching and range-of-motion exercise This exercise warms up your muscles and joints and improves the movement and flexibility of your shoulder. This exercise also helps to relieve pain and stiffness. Passive horizontal adduction In passive adduction, you use your other hand to move the injured arm toward your body. The injured arm does not move on its own. In this movement, your arm is moved across your body in the horizontal plane (horizontal adduction). 1. Sit or stand and pull your left / right elbow across your chest, toward your other shoulder. Stop when you feel a gentle stretch in the back of your shoulder and upper arm. ? Keep your arm at shoulder height. ? Keep your arm as close to your body as you comfortably can. 2. Hold for __________ seconds. 3. Slowly return to the starting position. Repeat __________ times. Complete this exercise __________ times a day. Strengthening exercises These exercises build strength and endurance in your shoulder. Endurance is the ability to use your muscles for a long time, even after they get tired. External rotation, isometric This is an exercise in which you press the back of your wrist against a door frame without moving your shoulder joint (isometric). 1. Stand or sit in a doorway, facing the door frame. 2. Bend your left / right elbow and place the back of your wrist against the door frame. Only the back of your wrist should be touching the frame. Keep your upper arm at your side. 3. Gently press your wrist against the door frame, as if  you are trying to push your arm away from your abdomen (external rotation). Press as hard as you are able without pain. ? Avoid shrugging your shoulder while you press your wrist against the door frame. Keep your shoulder blade tucked down toward the middle of your back. 4. Hold for __________ seconds. 5. Slowly release the tension, and relax your muscles completely before you repeat the exercise. Repeat __________ times. Complete this exercise __________ times a day. Internal rotation, isometric This is an exercise in which you press your palm against a door frame without moving your shoulder joint (isometric). 1. Stand or sit in a doorway, facing the door frame. 2. Bend your left / right elbow and place the palm of your hand against the door frame. Only your palm should be touching the frame. Keep your upper arm at your side. 3. Gently press your hand against the door frame, as if you are trying to push your arm toward your abdomen (internal rotation). Press as hard as you are able without pain. ? Avoid shrugging your shoulder while you press your hand against the door frame. Keep your shoulder blade tucked down toward the middle of your back. 4. Hold for __________ seconds. 5. Slowly release the tension, and relax your muscles completely before you repeat the exercise. Repeat __________ times. Complete this exercise __________ times a day. Scapular protraction, supine  1. Lie on your back on a firm surface (supine position). Hold a __________ weight in your left / right hand. 2. Raise your left / right arm straight   into the air so your hand is directly above your shoulder joint. 3. Push the weight into the air so your shoulder (scapula) lifts off the surface that you are lying on. The scapula will push up or forward (protraction). Do not move your head, neck, or back. 4. Hold for __________ seconds. 5. Slowly return to the starting position. Let your muscles relax completely before you repeat  this exercise. Repeat __________ times. Complete this exercise __________ times a day. Scapular retraction  1. Sit in a stable chair without armrests, or stand up. 2. Secure an exercise band to a stable object in front of you so the band is at shoulder height. 3. Hold one end of the exercise band in each hand. Your palms should face down. 4. Squeeze your shoulder blades together (retraction) and move your elbows slightly behind you. Do not shrug your shoulders upward while you do this. 5. Hold for __________ seconds. 6. Slowly return to the starting position. Repeat __________ times. Complete this exercise __________ times a day. Shoulder extension  1. Sit in a stable chair without armrests, or stand up. 2. Secure an exercise band to a stable object in front of you so the band is above shoulder height. 3. Hold one end of the exercise band in each hand. 4. Straighten your elbows and lift your hands up to shoulder height. 5. Squeeze your shoulder blades together and pull your hands down to the sides of your thighs (extension). Stop when your hands are straight down by your sides. Do not let your hands go behind your body. 6. Hold for __________ seconds. 7. Slowly return to the starting position. Repeat __________ times. Complete this exercise __________ times a day. This information is not intended to replace advice given to you by your health care provider. Make sure you discuss any questions you have with your health care provider. Document Revised: 08/19/2018 Document Reviewed: 05/23/2018 Elsevier Patient Education  2020 [employer]  
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
## 2019-11-14 DIAGNOSIS — M65332 Trigger finger, left middle finger: Secondary | ICD-10-CM | POA: Diagnosis not present

## 2019-11-14 DIAGNOSIS — M65311 Trigger thumb, right thumb: Secondary | ICD-10-CM | POA: Diagnosis not present

## 2019-11-14 NOTE — Telephone Encounter (Signed)
Can you please speak with patient about medication?

## 2019-11-14 NOTE — Telephone Encounter (Signed)
Called and spoke with patient.  She wanted to let us know that she has recently discontinued her Bistolic due to her BP improving with her weight loss.  She wants to get on schedule for surgery, best date for her is likely 7/26, and she is hoping to be first case due to her history of DM.  I told her I would let Debbie know and we'll see what we can do.  I suggested the previous Monday (7/19) but she states she would not want to be the last case of the day. She is following up with me on Friday to discuss surgical timeline, etc.

## 2019-11-17 ENCOUNTER — Ambulatory Visit: Payer: PPO | Admitting: Surgical

## 2019-11-17 ENCOUNTER — Other Ambulatory Visit: Payer: Self-pay

## 2019-11-29 ENCOUNTER — Inpatient Hospital Stay: Payer: PPO | Admitting: Orthopedic Surgery

## 2019-12-07 DIAGNOSIS — E1169 Type 2 diabetes mellitus with other specified complication: Secondary | ICD-10-CM | POA: Diagnosis not present

## 2019-12-07 DIAGNOSIS — E785 Hyperlipidemia, unspecified: Secondary | ICD-10-CM | POA: Diagnosis not present

## 2019-12-07 DIAGNOSIS — Z6825 Body mass index (BMI) 25.0-25.9, adult: Secondary | ICD-10-CM | POA: Diagnosis not present

## 2019-12-09 DIAGNOSIS — I10 Essential (primary) hypertension: Secondary | ICD-10-CM | POA: Diagnosis not present

## 2019-12-10 DIAGNOSIS — E1169 Type 2 diabetes mellitus with other specified complication: Secondary | ICD-10-CM | POA: Diagnosis not present

## 2019-12-10 DIAGNOSIS — I10 Essential (primary) hypertension: Secondary | ICD-10-CM | POA: Diagnosis not present

## 2019-12-10 DIAGNOSIS — E785 Hyperlipidemia, unspecified: Secondary | ICD-10-CM | POA: Diagnosis not present

## 2019-12-11 ENCOUNTER — Telehealth: Payer: Self-pay | Admitting: Orthopedic Surgery

## 2019-12-11 NOTE — Telephone Encounter (Signed)
Patient scheduled for right shoulder scope on 12-18-19. She is calling wanting to know if Dr. August Saucer  can put an injection in the left shoulder. She had initially been scheduled to include the injection, but asked to take off. Please advise.   Patient also wants to know if an injection can be put in the right shoulder to help with the pain   (????)   [phone]

## 2019-12-12 NOTE — Telephone Encounter (Signed)
Pls advise.  

## 2019-12-12 NOTE — Telephone Encounter (Signed)
Okay for left shoulder injection.  Please add to the consent.  Patient will have a block for the right shoulder so no injection needed in that shoulder.

## 2019-12-12 NOTE — Telephone Encounter (Signed)
See below from Dr Dean.  

## 2019-12-13 ENCOUNTER — Telehealth: Payer: Self-pay

## 2019-12-13 NOTE — Telephone Encounter (Signed)
Patient transferred to me regarding hospital facility charge for shoulder in jection in May - I explained hospital department vs profee.  Confirmed that billing charges document signed stating split charges. She said we never told her - I asked repeatedly to stop yelling at me while I was trying to explain.   She is very angry and cannot pay- I told her I understand but we are part of hospital - she said " cancel my appointments I will not be coming back" " will not pay"- will "go to jail" before paying and hung up on me.

## 2019-12-18 ENCOUNTER — Encounter: Payer: Self-pay | Admitting: Orthopedic Surgery

## 2019-12-18 ENCOUNTER — Other Ambulatory Visit: Payer: Self-pay | Admitting: Orthopedic Surgery

## 2019-12-18 DIAGNOSIS — M75111 Incomplete rotator cuff tear or rupture of right shoulder, not specified as traumatic: Secondary | ICD-10-CM | POA: Diagnosis not present

## 2019-12-18 DIAGNOSIS — M75121 Complete rotator cuff tear or rupture of right shoulder, not specified as traumatic: Secondary | ICD-10-CM | POA: Diagnosis not present

## 2019-12-18 DIAGNOSIS — G8918 Other acute postprocedural pain: Secondary | ICD-10-CM | POA: Diagnosis not present

## 2019-12-18 DIAGNOSIS — M7541 Impingement syndrome of right shoulder: Secondary | ICD-10-CM | POA: Diagnosis not present

## 2019-12-18 DIAGNOSIS — M19011 Primary osteoarthritis, right shoulder: Secondary | ICD-10-CM | POA: Diagnosis not present

## 2019-12-18 DIAGNOSIS — M7542 Impingement syndrome of left shoulder: Secondary | ICD-10-CM | POA: Diagnosis not present

## 2019-12-18 DIAGNOSIS — S43431A Superior glenoid labrum lesion of right shoulder, initial encounter: Secondary | ICD-10-CM | POA: Diagnosis not present

## 2019-12-18 DIAGNOSIS — M65311 Trigger thumb, right thumb: Secondary | ICD-10-CM | POA: Diagnosis not present

## 2019-12-18 MED ORDER — METHOCARBAMOL 500 MG PO TABS: 500.0000 mg | ORAL_TABLET | Freq: Three times a day (TID) | ORAL | 0 refills | Status: DC | PRN

## 2019-12-18 MED ORDER — HYDROCODONE-ACETAMINOPHEN 5-325 MG PO TABS: 1.0000 | ORAL_TABLET | Freq: Four times a day (QID) | ORAL | 0 refills | Status: DC | PRN

## 2019-12-18 MED ORDER — ONDANSETRON HCL 4 MG PO TABS: 4.0000 mg | ORAL_TABLET | Freq: Three times a day (TID) | ORAL | 0 refills | Status: DC | PRN

## 2019-12-19 ENCOUNTER — Telehealth: Payer: Self-pay

## 2019-12-19 NOTE — Telephone Encounter (Signed)
error 

## 2019-12-22 ENCOUNTER — Ambulatory Visit: Payer: PPO | Admitting: Physical Medicine & Rehabilitation

## 2019-12-27 ENCOUNTER — Ambulatory Visit (INDEPENDENT_AMBULATORY_CARE_PROVIDER_SITE_OTHER): Payer: PPO | Admitting: Orthopedic Surgery

## 2019-12-27 DIAGNOSIS — S46011A Strain of muscle(s) and tendon(s) of the rotator cuff of right shoulder, initial encounter: Secondary | ICD-10-CM

## 2019-12-30 ENCOUNTER — Encounter: Payer: Self-pay | Admitting: Orthopedic Surgery

## 2019-12-30 NOTE — Progress Notes (Signed)
° °  Post-Op Visit Note   Patient: Toni Chambers           Date of Birth: 26-Apr-1961           MRN: [id number] Visit Date: 12/27/2019 PCP: Abner Greenspan, MD   Assessment & Plan:  Chief Complaint:  Chief Complaint  Patient presents with   Post-op Follow-up   Visit Diagnoses:  1. Traumatic complete tear of right rotator cuff, initial encounter     Plan: Toni Chambers is a 59 year old patient who underwent right shoulder distal clavicle excision biceps tenodesis and mini open rotator cuff repair on 12/18/2019.  She is doing well.  All sutures discontinued today.  We started her on a home exercise program for range of motion per patient request.  She has some occasional popping in the thumb but it really feels like it is coming from the IP joint and not from any triggering.  Overall the right thumb feels significantly better.  No pain in that region.  Left shoulder also is improved from injection.  Plan is to week return to see Toni Chambers to recheck range of motion.  DC sling at that time but continue with passive range of motion only.  Decision point at that time will be for or against physical therapy depending on how the range of motion looks.  Follow-Up Instructions: Return in about 2 weeks (around 01/10/2020).   Orders:  No orders of the defined types were placed in this encounter.  No orders of the defined types were placed in this encounter.   Imaging: No results found.  PMFS History: Patient Active Problem List   Diagnosis Date Noted   Hypertension associated with diabetes (HCC) 01/05/2019   Hyperlipidemia associated with type 2 diabetes mellitus (HCC) 01/05/2019   Vitamin D deficiency 01/05/2019   Type 2 diabetes mellitus (HCC) 01/05/2019   Past Medical History:  Diagnosis Date   Anxiety    Cataract    Depression    Diabetes mellitus without complication (HCC)    Fibromyalgia    Hypertension    Neuromuscular disorder (HCC)    Fibromyalgia    Family History  Problem  Relation Age of Onset   Hypertension Mother    Cancer Mother    Diabetes Mother    Heart disease Father    Cancer Father    Depression Father    Hypertension Father    Diabetes Sister     Past Surgical History:  Procedure Laterality Date   BILATERAL CARPAL TUNNEL RELEASE     CHOLECYSTECTOMY     FRACTURE SURGERY     humerus   HERNIA REPAIR     TONSILLECTOMY  1970   TRIGGER FINGER RELEASE Right 08/24/2019   Procedure: RELEASE TRIGGER FINGER/A-1 PULLEY;  Surgeon: Betha Loa, MD;  Location: [facility];  Service: Orthopedics;  Laterality: Right;   Social History   Occupational History   Not on file  Tobacco Use   Smoking status: Former Smoker    Types: Cigarettes    Quit date: 05/11/1989    Years since quitting: 30.6   Smokeless tobacco: Never Used  Substance and Sexual Activity   Alcohol use: Not Currently   Drug use: Never   Sexual activity: Not on file

## 2020-01-09 DIAGNOSIS — E785 Hyperlipidemia, unspecified: Secondary | ICD-10-CM | POA: Diagnosis not present

## 2020-01-09 DIAGNOSIS — I10 Essential (primary) hypertension: Secondary | ICD-10-CM | POA: Diagnosis not present

## 2020-01-09 DIAGNOSIS — E1169 Type 2 diabetes mellitus with other specified complication: Secondary | ICD-10-CM | POA: Diagnosis not present

## 2020-01-10 DIAGNOSIS — E119 Type 2 diabetes mellitus without complications: Secondary | ICD-10-CM | POA: Diagnosis not present

## 2020-01-12 ENCOUNTER — Encounter: Payer: Self-pay | Admitting: Surgical

## 2020-01-12 ENCOUNTER — Ambulatory Visit (INDEPENDENT_AMBULATORY_CARE_PROVIDER_SITE_OTHER): Payer: PPO | Admitting: Surgical

## 2020-01-12 VITALS — Ht 59.5 in | Wt 129.0 lb

## 2020-01-12 DIAGNOSIS — S46011A Strain of muscle(s) and tendon(s) of the rotator cuff of right shoulder, initial encounter: Secondary | ICD-10-CM

## 2020-01-12 NOTE — Progress Notes (Addendum)
Post-Op Visit Note   Patient: Toni Chambers           Date of Birth: [birth year]           MRN: [id number] Visit Date: 01/12/2020 PCP: Abner Greenspan, MD   Assessment & Plan:  Chief Complaint:  Chief Complaint  Patient presents with  . Right Shoulder - Follow-up    12/18/2019 Right shoulder DCE, Biceps Tenodesis, Mini Open Rotator Cuff Repair   Visit Diagnoses:  1. Traumatic complete tear of right rotator cuff, initial encounter     Plan: Patient is a 59 year old female presents s/p right shoulder mini open rotator cuff repair, distal clavicle excision, biceps tenodesis on 12/18/2019.  Patient notes that she is doing well with occasional pain but nothing that requires narcotic pain medication.  Only taking Aleve for pain control.  She is not lifting heavy.  Denies any active range of motion overhead.  She is only doing pendulums.  Denies any fevers, chills, drainage.  She notes occasional trapezius pain on the right side but denies any neck pain or numbness/tingling.  Incisions are healing well on exam.  She does have a small Vicryl suture that has surfaced from the anterior portal incision.  This was removed today and there is no expressible drainage or signs of infection.  She has 30 degrees external rotation, 80 degrees abduction, 120 degrees forward flexion.  Good strength of the supraspinatus and subscapularis with mild weakness of the infraspinatus with pain.  No crepitus is felt with passive range of motion of the shoulder.  Plan for patient to start outpatient physical therapy, 1 time per week.  She will start active range of motion next week and begin strengthening 6 weeks out from procedure.  Patient understands and agrees with plan.  Follow-up in 4 weeks for clinical recheck.  Follow-Up Instructions: No follow-ups on file.   Orders:  No orders of the defined types were placed in this encounter.  No orders of the defined types were placed in this encounter.   Imaging: No  results found.  PMFS History: Patient Active Problem List   Diagnosis Date Noted  . Hypertension associated with diabetes (HCC) 01/05/2019  . Hyperlipidemia associated with type 2 diabetes mellitus (HCC) 01/05/2019  . Vitamin D deficiency 01/05/2019  . Type 2 diabetes mellitus (HCC) 01/05/2019   Past Medical History:  Diagnosis Date  . Anxiety   . Cataract   . Depression   . Diabetes mellitus without complication (HCC)   . Fibromyalgia   . Hypertension   . Neuromuscular disorder (HCC)    Fibromyalgia    Family History  Problem Relation Age of Onset  . Hypertension Mother   . Cancer Mother   . Diabetes Mother   . Heart disease Father   . Cancer Father   . Depression Father   . Hypertension Father   . Diabetes Sister     Past Surgical History:  Procedure Laterality Date  . BILATERAL CARPAL TUNNEL RELEASE    . CHOLECYSTECTOMY    . FRACTURE SURGERY     humerus  . HERNIA REPAIR    . TONSILLECTOMY  1970  . TRIGGER FINGER RELEASE Right 08/24/2019   Procedure: RELEASE TRIGGER FINGER/A-1 PULLEY;  Surgeon: Betha Loa, MD;  Location: [facility];  Service: Orthopedics;  Laterality: Right;   Social History   Occupational History  . Not on file  Tobacco Use  . Smoking status: Former Smoker    Types: Cigarettes  Quit date: 05/11/1989    Years since quitting: 30.6  . Smokeless tobacco: Never Used  Substance and Sexual Activity  . Alcohol use: Not Currently  . Drug use: Never  . Sexual activity: Not on file

## 2020-01-16 ENCOUNTER — Other Ambulatory Visit: Payer: Self-pay

## 2020-01-16 ENCOUNTER — Telehealth: Payer: Self-pay

## 2020-01-16 DIAGNOSIS — S46011A Strain of muscle(s) and tendon(s) of the rotator cuff of right shoulder, initial encounter: Secondary | ICD-10-CM

## 2020-01-16 NOTE — Telephone Encounter (Signed)
Done

## 2020-01-16 NOTE — Telephone Encounter (Signed)
Can you please document in referral that I have faxed this information? Thanks.

## 2020-01-16 NOTE — Telephone Encounter (Signed)
Toni Chambers with [facility].would like a Rx for right rotator cuff, last office note, and OP note faxed to [phone].  Cb# [phone].  Please advise.  Thank you.

## 2020-01-18 ENCOUNTER — Ambulatory Visit: Payer: PPO | Admitting: Physical Medicine & Rehabilitation

## 2020-01-18 DIAGNOSIS — M25511 Pain in right shoulder: Secondary | ICD-10-CM | POA: Diagnosis not present

## 2020-01-18 DIAGNOSIS — M75121 Complete rotator cuff tear or rupture of right shoulder, not specified as traumatic: Secondary | ICD-10-CM | POA: Diagnosis not present

## 2020-01-24 DIAGNOSIS — M25511 Pain in right shoulder: Secondary | ICD-10-CM | POA: Diagnosis not present

## 2020-01-24 DIAGNOSIS — M75121 Complete rotator cuff tear or rupture of right shoulder, not specified as traumatic: Secondary | ICD-10-CM | POA: Diagnosis not present

## 2020-01-29 DIAGNOSIS — Z23 Encounter for immunization: Secondary | ICD-10-CM | POA: Diagnosis not present

## 2020-02-01 DIAGNOSIS — F339 Major depressive disorder, recurrent, unspecified: Secondary | ICD-10-CM | POA: Diagnosis not present

## 2020-02-09 ENCOUNTER — Ambulatory Visit: Payer: PPO | Admitting: Physical Medicine & Rehabilitation

## 2020-02-09 ENCOUNTER — Ambulatory Visit: Payer: PPO | Admitting: Surgical

## 2020-02-09 DIAGNOSIS — E785 Hyperlipidemia, unspecified: Secondary | ICD-10-CM | POA: Diagnosis not present

## 2020-02-09 DIAGNOSIS — E1169 Type 2 diabetes mellitus with other specified complication: Secondary | ICD-10-CM | POA: Diagnosis not present

## 2020-02-09 DIAGNOSIS — I10 Essential (primary) hypertension: Secondary | ICD-10-CM | POA: Diagnosis not present

## 2020-02-09 DIAGNOSIS — F339 Major depressive disorder, recurrent, unspecified: Secondary | ICD-10-CM | POA: Diagnosis not present

## 2020-02-16 ENCOUNTER — Ambulatory Visit (INDEPENDENT_AMBULATORY_CARE_PROVIDER_SITE_OTHER): Payer: PPO | Admitting: Surgical

## 2020-02-16 ENCOUNTER — Encounter: Payer: Self-pay | Admitting: Surgical

## 2020-02-16 DIAGNOSIS — S46011A Strain of muscle(s) and tendon(s) of the rotator cuff of right shoulder, initial encounter: Secondary | ICD-10-CM

## 2020-02-16 NOTE — Progress Notes (Addendum)
   Post-Op Visit Note   Patient: Toni Chambers           Date of Birth: Feb 28, 1961           MRN: [id number] Visit Date: 02/16/2020 PCP: Abner Greenspan, MD   Assessment & Plan:  Chief Complaint:  Chief Complaint  Patient presents with   Right Shoulder - Follow-up   Visit Diagnoses: No diagnosis found.  Plan:  Patient is a 59 year old female presents s/p right shoulder arthroscopy with mini open rotator cuff repair and distal clavicle excision on 12/18/2019.  She notes that she is doing very well.  She is able to sleep normally without issue.  She does note that she went to physical therapy for 2 weeks and then had to discontinue due to financial issues.  She denies any difficulties with ADLs.  She has equivalent active range of motion compared with contralateral side.  Right shoulder with 45 degrees external rotation, 120 degrees abduction, 160 degrees forward flexion.  She has excellent strength of the rotator cuff aside from some weakness with infraspinatus.  She does note that she has not really been working on any strengthening she describes her home exercise program.  Showed her some exercises to work on infraspinatus strength.  Incisions are well-healed.  Follow-up in 6 weeks with Dr. August Saucer for final check.  Recommended against any significantly heavy lifting such as the cinderblocks that she states she has been carrying.  Follow-Up Instructions: No follow-ups on file.   Orders:  No orders of the defined types were placed in this encounter.  No orders of the defined types were placed in this encounter.   Imaging: No results found.  PMFS History: Patient Active Problem List   Diagnosis Date Noted   Hypertension associated with diabetes (HCC) 01/05/2019   Hyperlipidemia associated with type 2 diabetes mellitus (HCC) 01/05/2019   Vitamin D deficiency 01/05/2019   Type 2 diabetes mellitus (HCC) 01/05/2019   Past Medical History:  Diagnosis Date   Anxiety    Cataract     Depression    Diabetes mellitus without complication (HCC)    Fibromyalgia    Hypertension    Neuromuscular disorder (HCC)    Fibromyalgia    Family History  Problem Relation Age of Onset   Hypertension Mother    Cancer Mother    Diabetes Mother    Heart disease Father    Cancer Father    Depression Father    Hypertension Father    Diabetes Sister     Past Surgical History:  Procedure Laterality Date   BILATERAL CARPAL TUNNEL RELEASE     CHOLECYSTECTOMY     FRACTURE SURGERY     humerus   HERNIA REPAIR     TONSILLECTOMY  1970   TRIGGER FINGER RELEASE Right 08/24/2019   Procedure: RELEASE TRIGGER FINGER/A-1 PULLEY;  Surgeon: Betha Loa, MD;  Location: [facility];  Service: Orthopedics;  Laterality: Right;   Social History   Occupational History   Not on file  Tobacco Use   Smoking status: Former Smoker    Types: Cigarettes    Quit date: 05/11/1989    Years since quitting: 30.7   Smokeless tobacco: Never Used  Substance and Sexual Activity   Alcohol use: Not Currently   Drug use: Never   Sexual activity: Not on file

## 2020-02-27 DIAGNOSIS — E785 Hyperlipidemia, unspecified: Secondary | ICD-10-CM | POA: Diagnosis not present

## 2020-02-27 DIAGNOSIS — E1169 Type 2 diabetes mellitus with other specified complication: Secondary | ICD-10-CM | POA: Diagnosis not present

## 2020-03-11 DIAGNOSIS — E1169 Type 2 diabetes mellitus with other specified complication: Secondary | ICD-10-CM | POA: Diagnosis not present

## 2020-03-11 DIAGNOSIS — F339 Major depressive disorder, recurrent, unspecified: Secondary | ICD-10-CM | POA: Diagnosis not present

## 2020-03-11 DIAGNOSIS — E785 Hyperlipidemia, unspecified: Secondary | ICD-10-CM | POA: Diagnosis not present

## 2020-03-12 DIAGNOSIS — E1169 Type 2 diabetes mellitus with other specified complication: Secondary | ICD-10-CM | POA: Diagnosis not present

## 2020-03-12 DIAGNOSIS — E785 Hyperlipidemia, unspecified: Secondary | ICD-10-CM | POA: Diagnosis not present

## 2020-03-18 DIAGNOSIS — E1169 Type 2 diabetes mellitus with other specified complication: Secondary | ICD-10-CM | POA: Diagnosis not present

## 2020-03-18 DIAGNOSIS — E785 Hyperlipidemia, unspecified: Secondary | ICD-10-CM | POA: Diagnosis not present

## 2020-03-18 DIAGNOSIS — E1159 Type 2 diabetes mellitus with other circulatory complications: Secondary | ICD-10-CM | POA: Diagnosis not present

## 2020-03-18 DIAGNOSIS — I152 Hypertension secondary to endocrine disorders: Secondary | ICD-10-CM | POA: Diagnosis not present

## 2020-03-29 ENCOUNTER — Ambulatory Visit: Payer: PPO | Admitting: Orthopedic Surgery

## 2020-04-09 DIAGNOSIS — E1169 Type 2 diabetes mellitus with other specified complication: Secondary | ICD-10-CM | POA: Diagnosis not present

## 2020-04-09 DIAGNOSIS — E785 Hyperlipidemia, unspecified: Secondary | ICD-10-CM | POA: Diagnosis not present

## 2020-04-10 DIAGNOSIS — I152 Hypertension secondary to endocrine disorders: Secondary | ICD-10-CM | POA: Diagnosis not present

## 2020-04-10 DIAGNOSIS — E785 Hyperlipidemia, unspecified: Secondary | ICD-10-CM | POA: Diagnosis not present

## 2020-04-10 DIAGNOSIS — E1159 Type 2 diabetes mellitus with other circulatory complications: Secondary | ICD-10-CM | POA: Diagnosis not present

## 2020-04-10 DIAGNOSIS — E1169 Type 2 diabetes mellitus with other specified complication: Secondary | ICD-10-CM | POA: Diagnosis not present

## 2020-04-22 DIAGNOSIS — I152 Hypertension secondary to endocrine disorders: Secondary | ICD-10-CM | POA: Diagnosis not present

## 2020-04-22 DIAGNOSIS — E785 Hyperlipidemia, unspecified: Secondary | ICD-10-CM | POA: Diagnosis not present

## 2020-04-22 DIAGNOSIS — E1159 Type 2 diabetes mellitus with other circulatory complications: Secondary | ICD-10-CM | POA: Diagnosis not present

## 2020-04-22 DIAGNOSIS — E1169 Type 2 diabetes mellitus with other specified complication: Secondary | ICD-10-CM | POA: Diagnosis not present

## 2020-05-11 DIAGNOSIS — I152 Hypertension secondary to endocrine disorders: Secondary | ICD-10-CM | POA: Diagnosis not present

## 2020-05-11 DIAGNOSIS — E785 Hyperlipidemia, unspecified: Secondary | ICD-10-CM | POA: Diagnosis not present

## 2020-05-11 DIAGNOSIS — E1159 Type 2 diabetes mellitus with other circulatory complications: Secondary | ICD-10-CM | POA: Diagnosis not present

## 2020-05-24 DIAGNOSIS — M5136 Other intervertebral disc degeneration, lumbar region: Secondary | ICD-10-CM | POA: Diagnosis not present

## 2020-05-24 DIAGNOSIS — G2581 Restless legs syndrome: Secondary | ICD-10-CM | POA: Diagnosis not present

## 2020-05-24 DIAGNOSIS — M797 Fibromyalgia: Secondary | ICD-10-CM | POA: Diagnosis not present

## 2020-05-24 DIAGNOSIS — R413 Other amnesia: Secondary | ICD-10-CM | POA: Diagnosis not present

## 2020-06-06 DIAGNOSIS — Z681 Body mass index (BMI) 19 or less, adult: Secondary | ICD-10-CM | POA: Diagnosis not present

## 2020-06-06 DIAGNOSIS — F339 Major depressive disorder, recurrent, unspecified: Secondary | ICD-10-CM | POA: Diagnosis not present

## 2020-06-06 DIAGNOSIS — R413 Other amnesia: Secondary | ICD-10-CM | POA: Diagnosis not present

## 2020-06-07 ENCOUNTER — Ambulatory Visit (INDEPENDENT_AMBULATORY_CARE_PROVIDER_SITE_OTHER): Payer: HMO | Admitting: Surgical

## 2020-06-07 ENCOUNTER — Ambulatory Visit: Payer: Self-pay

## 2020-06-07 ENCOUNTER — Other Ambulatory Visit: Payer: Self-pay

## 2020-06-07 ENCOUNTER — Encounter: Payer: Self-pay | Admitting: Surgical

## 2020-06-07 DIAGNOSIS — G8929 Other chronic pain: Secondary | ICD-10-CM | POA: Diagnosis not present

## 2020-06-07 DIAGNOSIS — M25512 Pain in left shoulder: Secondary | ICD-10-CM | POA: Diagnosis not present

## 2020-06-07 DIAGNOSIS — M7542 Impingement syndrome of left shoulder: Secondary | ICD-10-CM

## 2020-06-09 ENCOUNTER — Encounter: Payer: Self-pay | Admitting: Surgical

## 2020-06-09 DIAGNOSIS — M7542 Impingement syndrome of left shoulder: Secondary | ICD-10-CM

## 2020-06-09 DIAGNOSIS — G8929 Other chronic pain: Secondary | ICD-10-CM | POA: Diagnosis not present

## 2020-06-09 DIAGNOSIS — M25512 Pain in left shoulder: Secondary | ICD-10-CM | POA: Diagnosis not present

## 2020-06-09 MED ORDER — BUPIVACAINE HCL 0.5 % IJ SOLN
9.0000 mL | INTRAMUSCULAR | Status: AC | PRN
  Administered 2020-06-09: 9 mL via INTRA_ARTICULAR

## 2020-06-09 MED ORDER — LIDOCAINE HCL 1 % IJ SOLN
5.0000 mL | INTRAMUSCULAR | Status: AC | PRN
  Administered 2020-06-09: 5 mL

## 2020-06-09 MED ORDER — METHYLPREDNISOLONE ACETATE 40 MG/ML IJ SUSP
40.0000 mg | INTRAMUSCULAR | Status: AC | PRN
  Administered 2020-06-09: 40 mg via INTRA_ARTICULAR

## 2020-06-09 NOTE — Progress Notes (Signed)
Office Visit Note   Patient: Toni Chambers           Date of Birth: [birth year]           MRN: [id number] Visit Date: 06/07/2020 Requested by: Abner Greenspan, MD No address on file PCP: Abner Greenspan, MD  Subjective: Chief Complaint  Patient presents with  . Left Shoulder - Pain    HPI: Toni Chambers is a 60 y.o. female who presents to the office complaining of left shoulder pain.  Patient notes long history of left shoulder pain.  She had a humerus fracture in 1980 that was treated with hardware placement and then removal months after once a fracture healed.  No surgeries following that procedure.  She localizes pain to the mid humerus with occasional radiation into her neck but no radiation down past her elbow.  No numbness or tingling.  She does notice difficult to lay on her left side due to pain.  She has some difficulty reaching out and lifting away from her body due to pain.  She has history of right shoulder rotator cuff repair last year.  Her right shoulder is doing well and she has excellent function with no recurrence of symptoms in the right shoulder.  She had an injection in the left shoulder at the time of her right shoulder surgery that provided 4 months of 100% relief..                ROS: All systems reviewed are negative as they relate to the chief complaint within the history of present illness.  Patient denies fevers or chills.  Assessment & Plan: Visit Diagnoses:  1. Impingement syndrome of left shoulder   2. Chronic left shoulder pain     Plan: Patient is a 60 year old female presents complaint of left shoulder pain.  She has history of left shoulder pain that was improved by her last injection in 2021 with 4 months of 100% relief.  She has history of humerus fracture on the left side that has left her with some stiffness of the left shoulder but no significant increase in stiffness according to her from her baseline.  She requests another injection.  This is  reasonable given her improvement with last injection.  Radiographs are negative for any acute findings or any significant degenerative changes.  She does have a small bone spur off the inferior acromion.  Left shoulder subacromial injection was administered and patient tolerated the procedure well.  Plan to follow-up as needed.  May consider MRI scan if her improvement is fleeting from the injection.  Follow-Up Instructions: No follow-ups on file.   Orders:  Orders Placed This Encounter  Procedures  . XR Shoulder Left   No orders of the defined types were placed in this encounter.     Procedures: Large Joint Inj: L subacromial bursa on 06/09/2020 1:03 PM Indications: diagnostic evaluation and pain Details: 18 G 1.5 in needle, posterior approach  Arthrogram: No  Medications: 9 mL bupivacaine 0.5 %; 40 mg methylPREDNISolone acetate 40 MG/ML; 5 mL lidocaine 1 % Outcome: tolerated well, no immediate complications Procedure, treatment alternatives, risks and benefits explained, specific risks discussed. Consent was given by the patient. Immediately prior to procedure a time out was called to verify the correct patient, procedure, equipment, support staff and site/side marked as required. Patient was prepped and draped in the usual sterile fashion.       Clinical Data: No additional findings.  Objective: Vital Signs: There  were no vitals taken for this visit.  Physical Exam:  Constitutional: Patient appears well-developed HEENT:  Head: Normocephalic Eyes:EOM are normal Neck: Normal range of motion Cardiovascular: Normal rate Pulmonary/chest: Effort normal Neurologic: Patient is alert Skin: Skin is warm Psychiatric: Patient has normal mood and affect  Ortho Exam: Ortho exam demonstrates left shoulder with decreased range of motion compared with the right shoulder.  Positive Neer and Hawkins impingement sign.  +5 motor strength of the supraspinatus, infraspinatus, subscapularis.   No tenderness over the [facility] joint.  Mild tenderness over the bicipital groove.  No tenderness throughout the axial cervical spine.  Negative Spurling sign.  No pain with cervical spine range of motion.  Specialty Comments:  No specialty comments available.  Imaging: No results found.   PMFS History: Patient Active Problem List   Diagnosis Date Noted  . Hypertension associated with diabetes (HCC) 01/05/2019  . Hyperlipidemia associated with type 2 diabetes mellitus (HCC) 01/05/2019  . Vitamin D deficiency 01/05/2019  . Type 2 diabetes mellitus (HCC) 01/05/2019   Past Medical History:  Diagnosis Date  . Anxiety   . Cataract   . Depression   . Diabetes mellitus without complication (HCC)   . Fibromyalgia   . Hypertension   . Neuromuscular disorder (HCC)    Fibromyalgia    Family History  Problem Relation Age of Onset  . Hypertension Mother   . Cancer Mother   . Diabetes Mother   . Heart disease Father   . Cancer Father   . Depression Father   . Hypertension Father   . Diabetes Sister     Past Surgical History:  Procedure Laterality Date  . BILATERAL CARPAL TUNNEL RELEASE    . CHOLECYSTECTOMY    . FRACTURE SURGERY     humerus  . HERNIA REPAIR    . TONSILLECTOMY  1970  . TRIGGER FINGER RELEASE Right 08/24/2019   Procedure: RELEASE TRIGGER FINGER/A-1 PULLEY;  Surgeon: Betha Loa, MD;  Location: [facility];  Service: Orthopedics;  Laterality: Right;   Social History   Occupational History  . Not on file  Tobacco Use  . Smoking status: Former Smoker    Types: Cigarettes    Quit date: 05/11/1989    Years since quitting: 31.1  . Smokeless tobacco: Never Used  Substance and Sexual Activity  . Alcohol use: Not Currently  . Drug use: Never  . Sexual activity: Not on file

## 2020-06-11 DIAGNOSIS — E1159 Type 2 diabetes mellitus with other circulatory complications: Secondary | ICD-10-CM | POA: Diagnosis not present

## 2020-06-11 DIAGNOSIS — E785 Hyperlipidemia, unspecified: Secondary | ICD-10-CM | POA: Diagnosis not present

## 2020-06-11 DIAGNOSIS — F339 Major depressive disorder, recurrent, unspecified: Secondary | ICD-10-CM | POA: Diagnosis not present

## 2020-06-11 DIAGNOSIS — I152 Hypertension secondary to endocrine disorders: Secondary | ICD-10-CM | POA: Diagnosis not present

## 2020-06-24 ENCOUNTER — Ambulatory Visit (INDEPENDENT_AMBULATORY_CARE_PROVIDER_SITE_OTHER): Payer: HMO

## 2020-06-24 ENCOUNTER — Ambulatory Visit (INDEPENDENT_AMBULATORY_CARE_PROVIDER_SITE_OTHER): Payer: HMO | Admitting: Orthopedic Surgery

## 2020-06-24 DIAGNOSIS — M792 Neuralgia and neuritis, unspecified: Secondary | ICD-10-CM | POA: Diagnosis not present

## 2020-06-29 ENCOUNTER — Encounter: Payer: Self-pay | Admitting: Orthopedic Surgery
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
## 2020-07-09 DIAGNOSIS — F339 Major depressive disorder, recurrent, unspecified: Secondary | ICD-10-CM | POA: Diagnosis not present

## 2020-07-09 DIAGNOSIS — E785 Hyperlipidemia, unspecified: Secondary | ICD-10-CM | POA: Diagnosis not present

## 2020-07-09 DIAGNOSIS — Z1231 Encounter for screening mammogram for malignant neoplasm of breast: Secondary | ICD-10-CM | POA: Diagnosis not present

## 2020-07-09 DIAGNOSIS — I152 Hypertension secondary to endocrine disorders: Secondary | ICD-10-CM | POA: Diagnosis not present

## 2020-07-09 DIAGNOSIS — E1159 Type 2 diabetes mellitus with other circulatory complications: Secondary | ICD-10-CM | POA: Diagnosis not present

## 2020-07-11 DIAGNOSIS — G4721 Circadian rhythm sleep disorder, delayed sleep phase type: Secondary | ICD-10-CM | POA: Diagnosis not present

## 2020-07-11 DIAGNOSIS — F339 Major depressive disorder, recurrent, unspecified: Secondary | ICD-10-CM | POA: Diagnosis not present

## 2020-07-15 ENCOUNTER — Telehealth: Payer: Self-pay | Admitting: Orthopedic Surgery

## 2020-07-15 DIAGNOSIS — E1169 Type 2 diabetes mellitus with other specified complication: Secondary | ICD-10-CM | POA: Diagnosis not present

## 2020-07-15 NOTE — Telephone Encounter (Signed)
Pt called wondering if Dr. August Saucer would be able to give her MRI results over the phone? She states if he absolutely needs to see her in office it's no problem.  [phone]

## 2020-07-16 NOTE — Telephone Encounter (Signed)
IC patient. Advised Dr August Saucer will call with results.  She will call us once her scan has been completed so Dr August Saucer can review.

## 2020-07-16 NOTE — Telephone Encounter (Signed)
Please advise 

## 2020-07-16 NOTE — Telephone Encounter (Signed)
Sure.  Happy to do that over the phone but her MRI scans are not until the 15th

## 2020-07-22 DIAGNOSIS — I152 Hypertension secondary to endocrine disorders: Secondary | ICD-10-CM | POA: Diagnosis not present

## 2020-07-22 DIAGNOSIS — E1169 Type 2 diabetes mellitus with other specified complication: Secondary | ICD-10-CM | POA: Diagnosis not present

## 2020-07-22 DIAGNOSIS — E785 Hyperlipidemia, unspecified: Secondary | ICD-10-CM | POA: Diagnosis not present

## 2020-07-22 DIAGNOSIS — E1159 Type 2 diabetes mellitus with other circulatory complications: Secondary | ICD-10-CM | POA: Diagnosis not present

## 2020-07-23 ENCOUNTER — Ambulatory Visit
Admission: RE | Admit: 2020-07-23 | Discharge: 2020-07-23 | Disposition: A | Discharge status: Home / Self Care | Payer: HMO | Source: Ambulatory Visit | Attending: Orthopedic Surgery | Admitting: Orthopedic Surgery

## 2020-07-23 ENCOUNTER — Other Ambulatory Visit: Payer: Self-pay

## 2020-07-23 DIAGNOSIS — G8929 Other chronic pain: Secondary | ICD-10-CM | POA: Diagnosis not present

## 2020-07-23 DIAGNOSIS — M792 Neuralgia and neuritis, unspecified: Secondary | ICD-10-CM

## 2020-07-23 DIAGNOSIS — M25512 Pain in left shoulder: Secondary | ICD-10-CM | POA: Diagnosis not present

## 2020-07-23 DIAGNOSIS — S46012A Strain of muscle(s) and tendon(s) of the rotator cuff of left shoulder, initial encounter: Secondary | ICD-10-CM | POA: Diagnosis not present

## 2020-07-23 DIAGNOSIS — M4722 Other spondylosis with radiculopathy, cervical region: Secondary | ICD-10-CM | POA: Diagnosis not present

## 2020-07-23 DIAGNOSIS — M50123 Cervical disc disorder at C6-C7 level with radiculopathy: Secondary | ICD-10-CM | POA: Diagnosis not present

## 2020-07-23 DIAGNOSIS — M4802 Spinal stenosis, cervical region: Secondary | ICD-10-CM | POA: Diagnosis not present

## 2020-07-23 MED ORDER — IOPAMIDOL (ISOVUE-M 200) INJECTION 41%
13.0000 mL | Freq: Once | INTRAMUSCULAR | Status: AC
  Administered 2020-07-23: 13 mL via INTRA_ARTICULAR

## 2020-07-25 ENCOUNTER — Ambulatory Visit: Payer: HMO | Admitting: Orthopedic Surgery

## 2020-07-25 NOTE — Telephone Encounter (Signed)
See below. Patient wanting MRI results.

## 2020-07-25 NOTE — Telephone Encounter (Signed)
Pt called asking if she can get a call regarding her test results

## 2020-07-29 ENCOUNTER — Other Ambulatory Visit: Payer: Self-pay | Admitting: Orthopedic Surgery

## 2020-07-29 MED ORDER — HYDROCODONE-ACETAMINOPHEN 5-325 MG PO TABS: 1.0000 | ORAL_TABLET | Freq: Four times a day (QID) | ORAL | 0 refills | Status: DC | PRN

## 2020-07-29 MED ORDER — METHOCARBAMOL 500 MG PO TABS: 500.0000 mg | ORAL_TABLET | Freq: Three times a day (TID) | ORAL | 0 refills | Status: DC | PRN

## 2020-07-29 NOTE — Telephone Encounter (Signed)
I called.  Discussed with her her scan results.  Actually less impressive than her contralateral side which she had done last year.  Supraspinatus tear with retraction should be fixable.  Risk and benefits discussed.  Plan for surgery.  Blue sheet filled out.  She will follow-up after that.

## 2020-07-29 NOTE — Telephone Encounter (Signed)
See below

## 2020-07-29 NOTE — Telephone Encounter (Signed)
Patient called wanting to speak with Dr. August Saucer today about her left shoulder MRI. 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   patient's call back  [phone]

## 2020-07-29 NOTE — Telephone Encounter (Signed)
I called thanks

## 2020-07-29 NOTE — Telephone Encounter (Signed)
patient called she stated she is still waiting for a call back regarding her mri results she stated she had a appointment 3/17 and she was marked for no show but she didn't cancel the appointment because she was waiting for the call and didn't want to cancel the appointment with a chance of not being seen until a later day and she stated she called the office and someone told her to keep the appointment . Call back:[phone]

## 2020-08-02 ENCOUNTER — Telehealth: Payer: Self-pay | Admitting: Orthopedic Surgery

## 2020-08-02 DIAGNOSIS — M792 Neuralgia and neuritis, unspecified: Secondary | ICD-10-CM

## 2020-08-02 NOTE — Telephone Encounter (Signed)
Patient is scheduled for left shoulder arthroscopy, debridement, biceps tenodesis, mini open rotator cuff tear repair and left middle finger trigger release at    [facility] on 09-02-20.  She stated Dr. August Saucer had mentioned her having an injection since she was still having a problem with her neck.  There is a note on the surgery sheet to Lauren in reference to scheduling an appointment with Dr. Alvester Morin.  I did not see any future appointments for her with Dr. Alvester Morin.  Not sure if this type of appointment would be made as a referral or would be made directly.  Please let patient know what the status is of the referral or appointment. Thanks  [phone]

## 2020-08-02 NOTE — Telephone Encounter (Signed)
Please advise. I do not recall this. Also nothing about this mentioned in last OV note.

## 2020-08-05 NOTE — Telephone Encounter (Signed)
Can one of you call patient about scheduling? I put in  order for injection

## 2020-08-05 NOTE — Telephone Encounter (Signed)
Okay for injection with [place] in her neck prior to surgery.  I think most of her symptoms are coming from her shoulder but we are going to do neck injection with Merlyn Albert hopefully before the shoulder surgery.  Thanks.  I do not think that was in the last note but we did talk about it.

## 2020-08-06 NOTE — Telephone Encounter (Signed)
Referral sent to Dr. Alvester Morin for review.

## 2020-08-09 DIAGNOSIS — E1169 Type 2 diabetes mellitus with other specified complication: Secondary | ICD-10-CM | POA: Diagnosis not present

## 2020-08-09 DIAGNOSIS — I152 Hypertension secondary to endocrine disorders: Secondary | ICD-10-CM | POA: Diagnosis not present

## 2020-08-09 DIAGNOSIS — E785 Hyperlipidemia, unspecified: Secondary | ICD-10-CM | POA: Diagnosis not present

## 2020-08-14 ENCOUNTER — Other Ambulatory Visit: Payer: Self-pay | Admitting: Family Medicine

## 2020-08-14 DIAGNOSIS — R928 Other abnormal and inconclusive findings on diagnostic imaging of breast: Secondary | ICD-10-CM | POA: Diagnosis not present

## 2020-08-14 DIAGNOSIS — R921 Mammographic calcification found on diagnostic imaging of breast: Secondary | ICD-10-CM

## 2020-08-15 ENCOUNTER — Telehealth: Payer: Self-pay

## 2020-08-15 NOTE — Telephone Encounter (Signed)
Patient will keep appointment for now and try to find a driver.

## 2020-08-15 NOTE — Telephone Encounter (Signed)
Patient called regarding her appointment she needs her appointment time to be reschduled due to transportation needing her to have a appointment before 10:00 am in order for them to pick her up call back:[phone]

## 2020-08-16 ENCOUNTER — Telehealth: Payer: Self-pay

## 2020-08-16 NOTE — Telephone Encounter (Signed)
Called and cancel pt appt.

## 2020-08-16 NOTE — Telephone Encounter (Signed)
Patient called she need her appointment for 4/19 cancelled due to her having surgery on her shoulder with Dr.Dean 4/25 patient will call back to r/s if surgery doesn't help with the pain she is having call [phone]

## 2020-08-21 ENCOUNTER — Ambulatory Visit
Admission: RE | Admit: 2020-08-21 | Discharge: 2020-08-21 | Disposition: A | Discharge status: Home / Self Care | Payer: HMO | Source: Ambulatory Visit | Attending: Family Medicine | Admitting: Family Medicine

## 2020-08-21 ENCOUNTER — Other Ambulatory Visit: Payer: Self-pay

## 2020-08-21 ENCOUNTER — Other Ambulatory Visit: Payer: Self-pay | Admitting: Family Medicine

## 2020-08-21 DIAGNOSIS — N6489 Other specified disorders of breast: Secondary | ICD-10-CM | POA: Diagnosis not present

## 2020-08-21 DIAGNOSIS — R921 Mammographic calcification found on diagnostic imaging of breast: Secondary | ICD-10-CM | POA: Diagnosis not present

## 2020-08-27 ENCOUNTER — Ambulatory Visit: Payer: HMO | Admitting: Physical Medicine and Rehabilitation

## 2020-09-02 ENCOUNTER — Other Ambulatory Visit: Payer: Self-pay | Admitting: Surgical

## 2020-09-02 ENCOUNTER — Encounter: Payer: Self-pay | Admitting: Orthopedic Surgery

## 2020-09-02 DIAGNOSIS — S43492D Other sprain of left shoulder joint, subsequent encounter: Secondary | ICD-10-CM | POA: Diagnosis not present

## 2020-09-02 DIAGNOSIS — M75122 Complete rotator cuff tear or rupture of left shoulder, not specified as traumatic: Secondary | ICD-10-CM | POA: Diagnosis not present

## 2020-09-02 DIAGNOSIS — X58XXXA Exposure to other specified factors, initial encounter: Secondary | ICD-10-CM | POA: Diagnosis not present

## 2020-09-02 DIAGNOSIS — M7522 Bicipital tendinitis, left shoulder: Secondary | ICD-10-CM | POA: Diagnosis not present

## 2020-09-02 DIAGNOSIS — M65332 Trigger finger, left middle finger: Secondary | ICD-10-CM | POA: Diagnosis not present

## 2020-09-02 DIAGNOSIS — Y999 Unspecified external cause status: Secondary | ICD-10-CM | POA: Diagnosis not present

## 2020-09-02 DIAGNOSIS — S43432A Superior glenoid labrum lesion of left shoulder, initial encounter: Secondary | ICD-10-CM | POA: Diagnosis not present

## 2020-09-02 DIAGNOSIS — G8918 Other acute postprocedural pain: Secondary | ICD-10-CM | POA: Diagnosis not present

## 2020-09-02 DIAGNOSIS — M24112 Other articular cartilage disorders, left shoulder: Secondary | ICD-10-CM | POA: Diagnosis not present

## 2020-09-02 DIAGNOSIS — M659 Synovitis and tenosynovitis, unspecified: Secondary | ICD-10-CM | POA: Diagnosis not present

## 2020-09-02 MED ORDER — OXYCODONE-ACETAMINOPHEN 5-325 MG PO TABS: 1.0000 | ORAL_TABLET | ORAL | 0 refills | Status: DC | PRN

## 2020-09-02 MED ORDER — ONDANSETRON HCL 4 MG PO TABS: 4.0000 mg | ORAL_TABLET | Freq: Three times a day (TID) | ORAL | 0 refills | Status: AC | PRN

## 2020-09-02 MED ORDER — KETOROLAC TROMETHAMINE 10 MG PO TABS: 10.0000 mg | ORAL_TABLET | Freq: Three times a day (TID) | ORAL | 0 refills | Status: AC | PRN

## 2020-09-02 MED ORDER — METHOCARBAMOL 500 MG PO TABS: 500.0000 mg | ORAL_TABLET | Freq: Three times a day (TID) | ORAL | 0 refills | Status: AC | PRN

## 2020-09-02 NOTE — Progress Notes (Signed)
Pt with left shoulder pain refractory to non op rx. Full thickness rct on mri. Also reports left middle trigger finger which she would like to have addressed. c spine mri mild to mod narrowing at l c 67 foramina. Most sxs likely from shoulder. As per phone conversation will plan on rgt repair with biceps tenodesis And middle tf release. She has had other tf releases done as well. All questions answered

## 2020-09-04 DIAGNOSIS — S46012A Strain of muscle(s) and tendon(s) of the rotator cuff of left shoulder, initial encounter: Secondary | ICD-10-CM | POA: Diagnosis not present

## 2020-09-08 DIAGNOSIS — F329 Major depressive disorder, single episode, unspecified: Secondary | ICD-10-CM | POA: Diagnosis not present

## 2020-09-08 DIAGNOSIS — E1169 Type 2 diabetes mellitus with other specified complication: Secondary | ICD-10-CM | POA: Diagnosis not present

## 2020-09-11 ENCOUNTER — Encounter: Payer: Self-pay | Admitting: Orthopedic Surgery

## 2020-09-11 ENCOUNTER — Other Ambulatory Visit: Payer: Self-pay

## 2020-09-11 ENCOUNTER — Ambulatory Visit (INDEPENDENT_AMBULATORY_CARE_PROVIDER_SITE_OTHER): Payer: HMO | Admitting: Orthopedic Surgery

## 2020-09-11 DIAGNOSIS — Z9889 Other specified postprocedural states: Secondary | ICD-10-CM

## 2020-09-11 MED ORDER — OXYCODONE-ACETAMINOPHEN 5-325 MG PO TABS: 1.0000 | ORAL_TABLET | Freq: Four times a day (QID) | ORAL | 0 refills | Status: AC | PRN

## 2020-09-11 NOTE — Progress Notes (Signed)
Post-Op Visit Note   Patient: Toni Chambers           Date of Birth: Sep 06, 1960           MRN: [id number] Visit Date: 09/11/2020 PCP: Abner Greenspan, MD   Assessment & Plan:  Chief Complaint:  Chief Complaint  Patient presents with  . Left Shoulder - Routine Post Op   Visit Diagnoses:  1. S/P left rotator cuff repair     Plan: Patient is a 60 year old female presents s/p left shoulder rotator cuff tear repair with biceps tenodesis and left long finger trigger finger release.  She had 12 mm x 15 mm x 15 mm supraspinatus tear.  She reports she is doing okay.  She does complain of increased pain in her hand relative to the left shoulder.  She is using CPM machine at 90 degrees for at least 2 hours/day for the left shoulder.  She sleeps okay at night.  Using pendulum exercises in addition to CPM machine.  Not lifting the arm or lifting anything with the arm.  Sutures are intact and were removed and replaced with Steri-Strips.  2 sutures were left in the trigger finger incision as the incision began to.  She will return on Monday afternoon for suture removal.  She does have swelling throughout the left upper extremity.  Axillary nerve intact with deltoid firing.  She has 15 degrees external rotation, 65 degrees abduction, 90 degrees forward flexion.  Plan to continue using sling and avoiding lifting with the arm.  Follow-up in 2 weeks for clinical recheck regarding the left shoulder.  She will follow-up for a nurse only visit on Monday afternoon of next week for suture removal.  Also plan to recheck patient's left thumb numbness and tingling at the time; this may be aftereffect from nerve block or just due to the swelling in the left upper extremity.  Follow-Up Instructions: No follow-ups on file.   Orders:  No orders of the defined types were placed in this encounter.  No orders of the defined types were placed in this encounter.   Imaging: No results found.  PMFS History: Patient  Active Problem List   Diagnosis Date Noted  . Hypertension associated with diabetes (HCC) 01/05/2019  . Hyperlipidemia associated with type 2 diabetes mellitus (HCC) 01/05/2019  . Vitamin D deficiency 01/05/2019  . Type 2 diabetes mellitus (HCC) 01/05/2019   Past Medical History:  Diagnosis Date  . Anxiety   . Cataract   . Depression   . Diabetes mellitus without complication (HCC)   . Fibromyalgia   . Hypertension   . Neuromuscular disorder (HCC)    Fibromyalgia    Family History  Problem Relation Age of Onset  . Hypertension Mother   . Cancer Mother   . Diabetes Mother   . Heart disease Father   . Cancer Father   . Depression Father   . Hypertension Father   . Diabetes Sister     Past Surgical History:  Procedure Laterality Date  . BILATERAL CARPAL TUNNEL RELEASE    . CHOLECYSTECTOMY    . FRACTURE SURGERY     humerus  . HERNIA REPAIR    . TONSILLECTOMY  1970  . TRIGGER FINGER RELEASE Right 08/24/2019   Procedure: RELEASE TRIGGER FINGER/A-1 PULLEY;  Surgeon: Betha Loa, MD;  Location: [facility];  Service: Orthopedics;  Laterality: Right;   Social History   Occupational History  . Not on file  Tobacco Use  .  Smoking status: Former Smoker    Types: Cigarettes    Quit date: 05/11/1989    Years since quitting: 31.3  . Smokeless tobacco: Never Used  Substance and Sexual Activity  . Alcohol use: Not Currently  . Drug use: Never  . Sexual activity: Not on file

## 2020-09-16 ENCOUNTER — Ambulatory Visit (INDEPENDENT_AMBULATORY_CARE_PROVIDER_SITE_OTHER): Payer: HMO | Admitting: Orthopedic Surgery

## 2020-09-16 ENCOUNTER — Encounter: Payer: Self-pay | Admitting: Orthopedic Surgery

## 2020-09-16 DIAGNOSIS — Z9889 Other specified postprocedural states: Secondary | ICD-10-CM

## 2020-09-18 ENCOUNTER — Encounter: Payer: Self-pay | Admitting: Orthopedic Surgery

## 2020-09-18 NOTE — Progress Notes (Signed)
   Post-Op Visit Note   Patient: Toni Chambers           Date of Birth: 08/30/1960           MRN: [id number] Visit Date: 09/16/2020 PCP: Abner Greenspan, MD   Assessment & Plan:  Chief Complaint:  Chief Complaint  Patient presents with  . Left Shoulder - Pain, Follow-up  . Left Middle Finger - Follow-up, Pain, Edema   Visit Diagnoses:  1. S/P left rotator cuff repair     Plan: Patient returns following left shoulder rotator cuff repair and left long finger trigger finger release.  She is here today just for suture removal.  She notes improvement in swelling and in the tingling that she noticed in her thumb.  Left shoulder is doing well and reinforced that patient is not to lift the arm under its own power and lift anything with the arm.  She is not wearing her sling today so she was counseled that she should continue to wear her sling at all times when out of the house and when she is in the house and not using the CPM machine or in the shower.  Sutures were removed and replaced with Steri-Strips.  There is very slight gapping but this was reapproximated with Steri-Strips.  She will follow-up in about 2 weeks for clinical recheck and initiation of physical therapy.  Continue CPM  Follow-Up Instructions: No follow-ups on file.   Orders:  No orders of the defined types were placed in this encounter.  No orders of the defined types were placed in this encounter.   Imaging: No results found.  PMFS History: Patient Active Problem List   Diagnosis Date Noted  . Hypertension associated with diabetes (HCC) 01/05/2019  . Hyperlipidemia associated with type 2 diabetes mellitus (HCC) 01/05/2019  . Vitamin D deficiency 01/05/2019  . Type 2 diabetes mellitus (HCC) 01/05/2019   Past Medical History:  Diagnosis Date  . Anxiety   . Cataract   . Depression   . Diabetes mellitus without complication (HCC)   . Fibromyalgia   . Hypertension   . Neuromuscular disorder (HCC)     Fibromyalgia    Family History  Problem Relation Age of Onset  . Hypertension Mother   . Cancer Mother   . Diabetes Mother   . Heart disease Father   . Cancer Father   . Depression Father   . Hypertension Father   . Diabetes Sister     Past Surgical History:  Procedure Laterality Date  . BILATERAL CARPAL TUNNEL RELEASE    . CHOLECYSTECTOMY    . FRACTURE SURGERY     humerus  . HERNIA REPAIR    . TONSILLECTOMY  1970  . TRIGGER FINGER RELEASE Right 08/24/2019   Procedure: RELEASE TRIGGER FINGER/A-1 PULLEY;  Surgeon: Betha Loa, MD;  Location: [facility];  Service: Orthopedics;  Laterality: Right;   Social History   Occupational History  . Not on file  Tobacco Use  . Smoking status: Former Smoker    Types: Cigarettes    Quit date: 05/11/1989    Years since quitting: 31.3  . Smokeless tobacco: Never Used  Substance and Sexual Activity  . Alcohol use: Not Currently  . Drug use: Never  . Sexual activity: Not on file

## 2020-09-21 ENCOUNTER — Encounter: Payer: Self-pay | Admitting: Orthopedic Surgery

## 2020-09-23 DIAGNOSIS — Z0000 Encounter for general adult medical examination without abnormal findings: Secondary | ICD-10-CM | POA: Diagnosis not present

## 2020-09-23 DIAGNOSIS — G2581 Restless legs syndrome: Secondary | ICD-10-CM | POA: Diagnosis not present

## 2020-09-23 DIAGNOSIS — Z139 Encounter for screening, unspecified: Secondary | ICD-10-CM | POA: Diagnosis not present

## 2020-09-23 DIAGNOSIS — Z6824 Body mass index (BMI) 24.0-24.9, adult: Secondary | ICD-10-CM | POA: Diagnosis not present

## 2020-09-23 DIAGNOSIS — M5136 Other intervertebral disc degeneration, lumbar region: Secondary | ICD-10-CM | POA: Diagnosis not present

## 2020-09-23 DIAGNOSIS — Z136 Encounter for screening for cardiovascular disorders: Secondary | ICD-10-CM | POA: Diagnosis not present

## 2020-09-23 DIAGNOSIS — Z7189 Other specified counseling: Secondary | ICD-10-CM | POA: Diagnosis not present

## 2020-09-23 DIAGNOSIS — F339 Major depressive disorder, recurrent, unspecified: Secondary | ICD-10-CM | POA: Diagnosis not present

## 2020-09-23 DIAGNOSIS — Z1339 Encounter for screening examination for other mental health and behavioral disorders: Secondary | ICD-10-CM | POA: Diagnosis not present

## 2020-09-23 DIAGNOSIS — Z1331 Encounter for screening for depression: Secondary | ICD-10-CM | POA: Diagnosis not present

## 2020-09-25 ENCOUNTER — Encounter: Payer: HMO | Admitting: Orthopedic Surgery

## 2020-10-02 ENCOUNTER — Ambulatory Visit (INDEPENDENT_AMBULATORY_CARE_PROVIDER_SITE_OTHER): Payer: HMO | Admitting: Orthopedic Surgery

## 2020-10-02 DIAGNOSIS — Z9889 Other specified postprocedural states: Secondary | ICD-10-CM

## 2020-10-03 ENCOUNTER — Encounter: Payer: Self-pay | Admitting: Orthopedic Surgery

## 2020-10-03 NOTE — Progress Notes (Signed)
Post-Op Visit Note   Patient: Toni Chambers           Date of Birth: 02-21-1961           MRN: [id number] Visit Date: 10/02/2020 PCP: Abner Greenspan, MD   Assessment & Plan:  Chief Complaint:  Chief Complaint  Patient presents with  . Left Hand - Follow-up    09/02/2020 left long trigger finger release  . Left Shoulder - Follow-up    09/02/2020 left shoulder RCR   Visit Diagnoses:  1. S/P left rotator cuff repair     Plan: Patient is a 60 year old female who presents s/p left shoulder rotator cuff repair and left long finger trigger finger release on 09/02/2020.  Left shoulder is doing well and she is compliant with not lifting anything and not lifting the arm under its own power.  She has minimal pain in her shoulder that she does not have to take any pain medication for.  She is remaining in the sling when she is up and about.  Left long finger is improving as well and she is improving the extension and the flexion of the finger.  No pain is waking her up at night.  She has recently started Lyrica which is prescribed from her primary care physician.  On exam she has 25 degrees external rotation, 90 degrees abduction, 105 degrees forward flexion of the operative shoulder.  Incisions on the shoulder and the hand are healing well.  Plan to start physical therapy; she will go for few sessions and then just transition to home exercise program.  Plan to follow-up in 6 weeks for clinical recheck.  Axillary nerve is intact and firing on exam.  She also has pretty good early strength of her rotator cuff when it was lightly stressed on examination today.  Follow-Up Instructions: No follow-ups on file.   Orders:  No orders of the defined types were placed in this encounter.  No orders of the defined types were placed in this encounter.   Imaging: No results found.  PMFS History: Patient Active Problem List   Diagnosis Date Noted  . Hypertension associated with diabetes (HCC) 01/05/2019   . Hyperlipidemia associated with type 2 diabetes mellitus (HCC) 01/05/2019  . Vitamin D deficiency 01/05/2019  . Type 2 diabetes mellitus (HCC) 01/05/2019   Past Medical History:  Diagnosis Date  . Anxiety   . Cataract   . Depression   . Diabetes mellitus without complication (HCC)   . Fibromyalgia   . Hypertension   . Neuromuscular disorder (HCC)    Fibromyalgia    Family History  Problem Relation Age of Onset  . Hypertension Mother   . Cancer Mother   . Diabetes Mother   . Heart disease Father   . Cancer Father   . Depression Father   . Hypertension Father   . Diabetes Sister     Past Surgical History:  Procedure Laterality Date  . BILATERAL CARPAL TUNNEL RELEASE    . CHOLECYSTECTOMY    . FRACTURE SURGERY     humerus  . HERNIA REPAIR    . TONSILLECTOMY  1970  . TRIGGER FINGER RELEASE Right 08/24/2019   Procedure: RELEASE TRIGGER FINGER/A-1 PULLEY;  Surgeon: Betha Loa, MD;  Location: [facility];  Service: Orthopedics;  Laterality: Right;   Social History   Occupational History  . Not on file  Tobacco Use  . Smoking status: Former Smoker    Types: Cigarettes    Quit  date: 05/11/1989    Years since quitting: 31.4  . Smokeless tobacco: Never Used  Substance and Sexual Activity  . Alcohol use: Not Currently  . Drug use: Never  . Sexual activity: Not on file

## 2020-10-09 DIAGNOSIS — E785 Hyperlipidemia, unspecified: Secondary | ICD-10-CM | POA: Diagnosis not present

## 2020-10-09 DIAGNOSIS — I152 Hypertension secondary to endocrine disorders: Secondary | ICD-10-CM | POA: Diagnosis not present

## 2020-10-09 DIAGNOSIS — E1169 Type 2 diabetes mellitus with other specified complication: Secondary | ICD-10-CM | POA: Diagnosis not present

## 2020-10-11 ENCOUNTER — Telehealth: Payer: Self-pay | Admitting: Surgical

## 2020-10-11 NOTE — Telephone Encounter (Signed)
Records 5-4 to present faxed to [facility] [phone]

## 2020-10-14 DIAGNOSIS — M75102 Unspecified rotator cuff tear or rupture of left shoulder, not specified as traumatic: Secondary | ICD-10-CM | POA: Diagnosis not present

## 2020-10-14 DIAGNOSIS — M25512 Pain in left shoulder: Secondary | ICD-10-CM | POA: Diagnosis not present

## 2020-10-22 DIAGNOSIS — M75102 Unspecified rotator cuff tear or rupture of left shoulder, not specified as traumatic: Secondary | ICD-10-CM | POA: Diagnosis not present

## 2020-10-22 DIAGNOSIS — M25512 Pain in left shoulder: Secondary | ICD-10-CM | POA: Diagnosis not present

## 2020-11-08 DIAGNOSIS — I152 Hypertension secondary to endocrine disorders: Secondary | ICD-10-CM | POA: Diagnosis not present

## 2020-11-08 DIAGNOSIS — E785 Hyperlipidemia, unspecified: Secondary | ICD-10-CM | POA: Diagnosis not present

## 2020-11-08 DIAGNOSIS — E1169 Type 2 diabetes mellitus with other specified complication: Secondary | ICD-10-CM | POA: Diagnosis not present

## 2020-11-12 DIAGNOSIS — E1159 Type 2 diabetes mellitus with other circulatory complications: Secondary | ICD-10-CM | POA: Diagnosis not present

## 2020-11-12 DIAGNOSIS — E1169 Type 2 diabetes mellitus with other specified complication: Secondary | ICD-10-CM | POA: Diagnosis not present

## 2020-11-13 ENCOUNTER — Encounter: Payer: HMO | Admitting: Orthopedic Surgery

## 2020-11-18 DIAGNOSIS — E785 Hyperlipidemia, unspecified: Secondary | ICD-10-CM | POA: Diagnosis not present

## 2020-11-18 DIAGNOSIS — I152 Hypertension secondary to endocrine disorders: Secondary | ICD-10-CM | POA: Diagnosis not present

## 2020-11-18 DIAGNOSIS — E1169 Type 2 diabetes mellitus with other specified complication: Secondary | ICD-10-CM | POA: Diagnosis not present

## 2020-11-18 DIAGNOSIS — F314 Bipolar disorder, current episode depressed, severe, without psychotic features: Secondary | ICD-10-CM | POA: Diagnosis not present

## 2020-11-18 DIAGNOSIS — E1159 Type 2 diabetes mellitus with other circulatory complications: Secondary | ICD-10-CM | POA: Diagnosis not present

## 2020-12-09 DIAGNOSIS — E1159 Type 2 diabetes mellitus with other circulatory complications: Secondary | ICD-10-CM | POA: Diagnosis not present

## 2020-12-09 DIAGNOSIS — E785 Hyperlipidemia, unspecified: Secondary | ICD-10-CM | POA: Diagnosis not present

## 2020-12-09 DIAGNOSIS — F314 Bipolar disorder, current episode depressed, severe, without psychotic features: Secondary | ICD-10-CM | POA: Diagnosis not present

## 2020-12-16 DIAGNOSIS — F314 Bipolar disorder, current episode depressed, severe, without psychotic features: Secondary | ICD-10-CM | POA: Diagnosis not present

## 2020-12-16 DIAGNOSIS — Z6826 Body mass index (BMI) 26.0-26.9, adult: Secondary | ICD-10-CM | POA: Diagnosis not present

## 2021-01-09 DIAGNOSIS — E1159 Type 2 diabetes mellitus with other circulatory complications: Secondary | ICD-10-CM | POA: Diagnosis not present

## 2021-01-09 DIAGNOSIS — F314 Bipolar disorder, current episode depressed, severe, without psychotic features: Secondary | ICD-10-CM | POA: Diagnosis not present

## 2021-01-09 DIAGNOSIS — E785 Hyperlipidemia, unspecified: Secondary | ICD-10-CM | POA: Diagnosis not present

## 2021-02-08 DIAGNOSIS — F314 Bipolar disorder, current episode depressed, severe, without psychotic features: Secondary | ICD-10-CM | POA: Diagnosis not present

## 2021-02-08 DIAGNOSIS — E1159 Type 2 diabetes mellitus with other circulatory complications: Secondary | ICD-10-CM | POA: Diagnosis not present

## 2021-02-24 DIAGNOSIS — E1169 Type 2 diabetes mellitus with other specified complication: Secondary | ICD-10-CM | POA: Diagnosis not present

## 2021-02-24 DIAGNOSIS — E1159 Type 2 diabetes mellitus with other circulatory complications: Secondary | ICD-10-CM | POA: Diagnosis not present

## 2021-03-03 DIAGNOSIS — Z23 Encounter for immunization: Secondary | ICD-10-CM | POA: Diagnosis not present

## 2021-03-03 DIAGNOSIS — M797 Fibromyalgia: Secondary | ICD-10-CM | POA: Diagnosis not present

## 2021-03-03 DIAGNOSIS — I152 Hypertension secondary to endocrine disorders: Secondary | ICD-10-CM | POA: Diagnosis not present

## 2021-03-03 DIAGNOSIS — E1159 Type 2 diabetes mellitus with other circulatory complications: Secondary | ICD-10-CM | POA: Diagnosis not present

## 2021-03-03 DIAGNOSIS — F314 Bipolar disorder, current episode depressed, severe, without psychotic features: Secondary | ICD-10-CM | POA: Diagnosis not present

## 2021-03-11 DIAGNOSIS — F314 Bipolar disorder, current episode depressed, severe, without psychotic features: Secondary | ICD-10-CM | POA: Diagnosis not present

## 2021-03-11 DIAGNOSIS — E1159 Type 2 diabetes mellitus with other circulatory complications: Secondary | ICD-10-CM | POA: Diagnosis not present

## 2021-03-18 DIAGNOSIS — Z6830 Body mass index (BMI) 30.0-30.9, adult: Secondary | ICD-10-CM | POA: Diagnosis not present

## 2021-03-18 DIAGNOSIS — E669 Obesity, unspecified: Secondary | ICD-10-CM | POA: Diagnosis not present

## 2021-04-08 DIAGNOSIS — Z6830 Body mass index (BMI) 30.0-30.9, adult: Secondary | ICD-10-CM | POA: Diagnosis not present

## 2021-04-08 DIAGNOSIS — E669 Obesity, unspecified: Secondary | ICD-10-CM | POA: Diagnosis not present

## 2021-04-10 DIAGNOSIS — F314 Bipolar disorder, current episode depressed, severe, without psychotic features: Secondary | ICD-10-CM | POA: Diagnosis not present

## 2021-04-10 DIAGNOSIS — E1159 Type 2 diabetes mellitus with other circulatory complications: Secondary | ICD-10-CM | POA: Diagnosis not present

## 2021-06-11 DIAGNOSIS — F314 Bipolar disorder, current episode depressed, severe, without psychotic features: Secondary | ICD-10-CM | POA: Diagnosis not present

## 2021-06-11 DIAGNOSIS — E1169 Type 2 diabetes mellitus with other specified complication: Secondary | ICD-10-CM | POA: Diagnosis not present

## 2021-06-25 DIAGNOSIS — E1169 Type 2 diabetes mellitus with other specified complication: Secondary | ICD-10-CM | POA: Diagnosis not present

## 2021-07-03 DIAGNOSIS — M79675 Pain in left toe(s): Secondary | ICD-10-CM | POA: Diagnosis not present

## 2021-07-03 DIAGNOSIS — E785 Hyperlipidemia, unspecified: Secondary | ICD-10-CM | POA: Diagnosis not present

## 2021-07-03 DIAGNOSIS — E1169 Type 2 diabetes mellitus with other specified complication: Secondary | ICD-10-CM | POA: Diagnosis not present

## 2021-07-03 DIAGNOSIS — B351 Tinea unguium: Secondary | ICD-10-CM | POA: Diagnosis not present

## 2021-07-09 DIAGNOSIS — F314 Bipolar disorder, current episode depressed, severe, without psychotic features: Secondary | ICD-10-CM | POA: Diagnosis not present

## 2021-07-09 DIAGNOSIS — E1169 Type 2 diabetes mellitus with other specified complication: Secondary | ICD-10-CM | POA: Diagnosis not present

## 2021-08-09 DIAGNOSIS — E1165 Type 2 diabetes mellitus with hyperglycemia: Secondary | ICD-10-CM | POA: Diagnosis not present

## 2021-08-09 DIAGNOSIS — F314 Bipolar disorder, current episode depressed, severe, without psychotic features: Secondary | ICD-10-CM | POA: Diagnosis not present

## 2021-09-08 DIAGNOSIS — E1165 Type 2 diabetes mellitus with hyperglycemia: Secondary | ICD-10-CM | POA: Diagnosis not present

## 2021-09-08 DIAGNOSIS — F314 Bipolar disorder, current episode depressed, severe, without psychotic features: Secondary | ICD-10-CM | POA: Diagnosis not present

## 2021-09-23 DIAGNOSIS — E1169 Type 2 diabetes mellitus with other specified complication: Secondary | ICD-10-CM | POA: Diagnosis not present

## 2021-09-30 DIAGNOSIS — Z0189 Encounter for other specified special examinations: Secondary | ICD-10-CM | POA: Diagnosis not present

## 2021-09-30 DIAGNOSIS — Z136 Encounter for screening for cardiovascular disorders: Secondary | ICD-10-CM | POA: Diagnosis not present

## 2021-09-30 DIAGNOSIS — Z0000 Encounter for general adult medical examination without abnormal findings: Secondary | ICD-10-CM | POA: Diagnosis not present

## 2021-09-30 DIAGNOSIS — Z1389 Encounter for screening for other disorder: Secondary | ICD-10-CM | POA: Diagnosis not present

## 2021-09-30 DIAGNOSIS — R928 Other abnormal and inconclusive findings on diagnostic imaging of breast: Secondary | ICD-10-CM | POA: Diagnosis not present

## 2021-09-30 DIAGNOSIS — E1169 Type 2 diabetes mellitus with other specified complication: Secondary | ICD-10-CM | POA: Diagnosis not present

## 2021-09-30 DIAGNOSIS — Z7189 Other specified counseling: Secondary | ICD-10-CM | POA: Diagnosis not present

## 2021-09-30 DIAGNOSIS — E785 Hyperlipidemia, unspecified: Secondary | ICD-10-CM | POA: Diagnosis not present

## 2021-09-30 DIAGNOSIS — Z1331 Encounter for screening for depression: Secondary | ICD-10-CM | POA: Diagnosis not present

## 2021-09-30 DIAGNOSIS — Z1339 Encounter for screening examination for other mental health and behavioral disorders: Secondary | ICD-10-CM | POA: Diagnosis not present

## 2021-09-30 DIAGNOSIS — Z139 Encounter for screening, unspecified: Secondary | ICD-10-CM | POA: Diagnosis not present

## 2021-10-07 DIAGNOSIS — S93402A Sprain of unspecified ligament of left ankle, initial encounter: Secondary | ICD-10-CM | POA: Diagnosis not present

## 2021-10-07 DIAGNOSIS — M79672 Pain in left foot: Secondary | ICD-10-CM | POA: Diagnosis not present

## 2021-10-07 DIAGNOSIS — M1990 Unspecified osteoarthritis, unspecified site: Secondary | ICD-10-CM | POA: Diagnosis not present

## 2021-10-07 DIAGNOSIS — Z6830 Body mass index (BMI) 30.0-30.9, adult: Secondary | ICD-10-CM | POA: Diagnosis not present

## 2021-10-07 DIAGNOSIS — M25552 Pain in left hip: Secondary | ICD-10-CM | POA: Diagnosis not present

## 2021-10-07 DIAGNOSIS — M7732 Calcaneal spur, left foot: Secondary | ICD-10-CM | POA: Diagnosis not present

## 2021-10-07 DIAGNOSIS — S99922A Unspecified injury of left foot, initial encounter: Secondary | ICD-10-CM | POA: Diagnosis not present

## 2021-10-07 DIAGNOSIS — Y92009 Unspecified place in unspecified non-institutional (private) residence as the place of occurrence of the external cause: Secondary | ICD-10-CM | POA: Diagnosis not present

## 2021-10-07 DIAGNOSIS — W19XXXA Unspecified fall, initial encounter: Secondary | ICD-10-CM | POA: Diagnosis not present

## 2021-10-07 DIAGNOSIS — M19072 Primary osteoarthritis, left ankle and foot: Secondary | ICD-10-CM | POA: Diagnosis not present

## 2021-10-09 DIAGNOSIS — F314 Bipolar disorder, current episode depressed, severe, without psychotic features: Secondary | ICD-10-CM | POA: Diagnosis not present

## 2021-10-09 DIAGNOSIS — E1165 Type 2 diabetes mellitus with hyperglycemia: Secondary | ICD-10-CM | POA: Diagnosis not present

## 2021-10-10 ENCOUNTER — Other Ambulatory Visit: Payer: Self-pay | Admitting: Family Medicine

## 2021-10-27 DIAGNOSIS — R2689 Other abnormalities of gait and mobility: Secondary | ICD-10-CM | POA: Diagnosis not present

## 2021-10-27 DIAGNOSIS — S93602D Unspecified sprain of left foot, subsequent encounter: Secondary | ICD-10-CM | POA: Diagnosis not present

## 2021-11-08 DIAGNOSIS — F314 Bipolar disorder, current episode depressed, severe, without psychotic features: Secondary | ICD-10-CM | POA: Diagnosis not present

## 2021-11-08 DIAGNOSIS — E1165 Type 2 diabetes mellitus with hyperglycemia: Secondary | ICD-10-CM | POA: Diagnosis not present

## 2022-01-09 DIAGNOSIS — F314 Bipolar disorder, current episode depressed, severe, without psychotic features: Secondary | ICD-10-CM | POA: Diagnosis not present

## 2022-01-09 DIAGNOSIS — E1165 Type 2 diabetes mellitus with hyperglycemia: Secondary | ICD-10-CM | POA: Diagnosis not present

## 2022-02-08 DIAGNOSIS — E1165 Type 2 diabetes mellitus with hyperglycemia: Secondary | ICD-10-CM | POA: Diagnosis not present

## 2022-02-08 DIAGNOSIS — F314 Bipolar disorder, current episode depressed, severe, without psychotic features: Secondary | ICD-10-CM | POA: Diagnosis not present

## 2022-02-12 DIAGNOSIS — F4321 Adjustment disorder with depressed mood: Secondary | ICD-10-CM | POA: Diagnosis not present

## 2022-02-12 DIAGNOSIS — F314 Bipolar disorder, current episode depressed, severe, without psychotic features: Secondary | ICD-10-CM | POA: Diagnosis not present

## 2022-02-12 DIAGNOSIS — I152 Hypertension secondary to endocrine disorders: Secondary | ICD-10-CM | POA: Diagnosis not present

## 2022-02-12 DIAGNOSIS — Z6830 Body mass index (BMI) 30.0-30.9, adult: Secondary | ICD-10-CM | POA: Diagnosis not present

## 2022-02-12 DIAGNOSIS — E1159 Type 2 diabetes mellitus with other circulatory complications: Secondary | ICD-10-CM | POA: Diagnosis not present

## 2022-05-11 DIAGNOSIS — E1169 Type 2 diabetes mellitus with other specified complication: Secondary | ICD-10-CM | POA: Diagnosis not present

## 2022-05-11 DIAGNOSIS — E669 Obesity, unspecified: Secondary | ICD-10-CM | POA: Diagnosis not present

## 2022-05-13 DIAGNOSIS — E669 Obesity, unspecified: Secondary | ICD-10-CM | POA: Diagnosis not present

## 2022-05-13 DIAGNOSIS — E1159 Type 2 diabetes mellitus with other circulatory complications: Secondary | ICD-10-CM | POA: Diagnosis not present

## 2022-05-13 DIAGNOSIS — Z713 Dietary counseling and surveillance: Secondary | ICD-10-CM | POA: Diagnosis not present

## 2022-05-13 DIAGNOSIS — Z6832 Body mass index (BMI) 32.0-32.9, adult: Secondary | ICD-10-CM | POA: Diagnosis not present

## 2022-05-14 DIAGNOSIS — I152 Hypertension secondary to endocrine disorders: Secondary | ICD-10-CM | POA: Diagnosis not present

## 2022-05-14 DIAGNOSIS — Z9181 History of falling: Secondary | ICD-10-CM | POA: Diagnosis not present

## 2022-05-14 DIAGNOSIS — Z23 Encounter for immunization: Secondary | ICD-10-CM | POA: Diagnosis not present

## 2022-05-14 DIAGNOSIS — E785 Hyperlipidemia, unspecified: Secondary | ICD-10-CM | POA: Diagnosis not present

## 2022-05-14 DIAGNOSIS — E1159 Type 2 diabetes mellitus with other circulatory complications: Secondary | ICD-10-CM | POA: Diagnosis not present

## 2022-05-14 DIAGNOSIS — E1169 Type 2 diabetes mellitus with other specified complication: Secondary | ICD-10-CM | POA: Diagnosis not present

## 2022-05-14 DIAGNOSIS — Z6832 Body mass index (BMI) 32.0-32.9, adult: Secondary | ICD-10-CM | POA: Diagnosis not present

## 2022-05-20 DIAGNOSIS — E785 Hyperlipidemia, unspecified: Secondary | ICD-10-CM | POA: Diagnosis not present

## 2022-05-20 DIAGNOSIS — Z9181 History of falling: Secondary | ICD-10-CM | POA: Diagnosis not present

## 2022-05-20 DIAGNOSIS — Z6832 Body mass index (BMI) 32.0-32.9, adult: Secondary | ICD-10-CM | POA: Diagnosis not present

## 2022-05-20 DIAGNOSIS — E1169 Type 2 diabetes mellitus with other specified complication: Secondary | ICD-10-CM | POA: Diagnosis not present

## 2022-05-20 DIAGNOSIS — Z713 Dietary counseling and surveillance: Secondary | ICD-10-CM | POA: Diagnosis not present

## 2022-05-26 DIAGNOSIS — Z1231 Encounter for screening mammogram for malignant neoplasm of breast: Secondary | ICD-10-CM | POA: Diagnosis not present

## 2022-05-27 DIAGNOSIS — S61209A Unspecified open wound of unspecified finger without damage to nail, initial encounter: Secondary | ICD-10-CM | POA: Diagnosis not present

## 2022-05-27 DIAGNOSIS — Z139 Encounter for screening, unspecified: Secondary | ICD-10-CM | POA: Diagnosis not present

## 2022-05-27 DIAGNOSIS — Z6832 Body mass index (BMI) 32.0-32.9, adult: Secondary | ICD-10-CM | POA: Diagnosis not present

## 2022-05-27 DIAGNOSIS — I152 Hypertension secondary to endocrine disorders: Secondary | ICD-10-CM | POA: Diagnosis not present

## 2022-05-27 DIAGNOSIS — M79645 Pain in left finger(s): Secondary | ICD-10-CM | POA: Diagnosis not present

## 2022-05-27 DIAGNOSIS — S67191A Crushing injury of left index finger, initial encounter: Secondary | ICD-10-CM | POA: Diagnosis not present

## 2022-06-05 DIAGNOSIS — Z1212 Encounter for screening for malignant neoplasm of rectum: Secondary | ICD-10-CM | POA: Diagnosis not present

## 2022-06-05 DIAGNOSIS — Z1211 Encounter for screening for malignant neoplasm of colon: Secondary | ICD-10-CM | POA: Diagnosis not present

## 2022-06-11 DIAGNOSIS — F314 Bipolar disorder, current episode depressed, severe, without psychotic features: Secondary | ICD-10-CM | POA: Diagnosis not present

## 2022-06-11 DIAGNOSIS — E1159 Type 2 diabetes mellitus with other circulatory complications: Secondary | ICD-10-CM | POA: Diagnosis not present

## 2022-07-10 DIAGNOSIS — E1159 Type 2 diabetes mellitus with other circulatory complications: Secondary | ICD-10-CM | POA: Diagnosis not present

## 2022-07-10 DIAGNOSIS — F314 Bipolar disorder, current episode depressed, severe, without psychotic features: Secondary | ICD-10-CM | POA: Diagnosis not present

## 2022-08-10 DIAGNOSIS — E1159 Type 2 diabetes mellitus with other circulatory complications: Secondary | ICD-10-CM | POA: Diagnosis not present

## 2022-08-10 DIAGNOSIS — F314 Bipolar disorder, current episode depressed, severe, without psychotic features: Secondary | ICD-10-CM | POA: Diagnosis not present

## 2022-09-09 DIAGNOSIS — F314 Bipolar disorder, current episode depressed, severe, without psychotic features: Secondary | ICD-10-CM | POA: Diagnosis not present

## 2022-09-09 DIAGNOSIS — E1159 Type 2 diabetes mellitus with other circulatory complications: Secondary | ICD-10-CM | POA: Diagnosis not present

## 2022-10-08 DIAGNOSIS — E1169 Type 2 diabetes mellitus with other specified complication: Secondary | ICD-10-CM | POA: Diagnosis not present

## 2022-10-10 DIAGNOSIS — E1159 Type 2 diabetes mellitus with other circulatory complications: Secondary | ICD-10-CM | POA: Diagnosis not present

## 2022-10-10 DIAGNOSIS — F314 Bipolar disorder, current episode depressed, severe, without psychotic features: Secondary | ICD-10-CM | POA: Diagnosis not present

## 2022-10-15 DIAGNOSIS — Z139 Encounter for screening, unspecified: Secondary | ICD-10-CM | POA: Diagnosis not present

## 2022-10-15 DIAGNOSIS — Z1331 Encounter for screening for depression: Secondary | ICD-10-CM | POA: Diagnosis not present

## 2022-10-15 DIAGNOSIS — Z1231 Encounter for screening mammogram for malignant neoplasm of breast: Secondary | ICD-10-CM | POA: Diagnosis not present

## 2022-10-15 DIAGNOSIS — Z1339 Encounter for screening examination for other mental health and behavioral disorders: Secondary | ICD-10-CM | POA: Diagnosis not present

## 2022-10-15 DIAGNOSIS — Z136 Encounter for screening for cardiovascular disorders: Secondary | ICD-10-CM | POA: Diagnosis not present

## 2022-10-15 DIAGNOSIS — Z0000 Encounter for general adult medical examination without abnormal findings: Secondary | ICD-10-CM | POA: Diagnosis not present

## 2022-10-15 DIAGNOSIS — E663 Overweight: Secondary | ICD-10-CM | POA: Diagnosis not present

## 2022-10-15 DIAGNOSIS — E1159 Type 2 diabetes mellitus with other circulatory complications: Secondary | ICD-10-CM | POA: Diagnosis not present

## 2022-10-15 DIAGNOSIS — Z6833 Body mass index (BMI) 33.0-33.9, adult: Secondary | ICD-10-CM | POA: Diagnosis not present

## 2022-10-15 DIAGNOSIS — E1169 Type 2 diabetes mellitus with other specified complication: Secondary | ICD-10-CM | POA: Diagnosis not present

## 2022-10-15 DIAGNOSIS — I152 Hypertension secondary to endocrine disorders: Secondary | ICD-10-CM | POA: Diagnosis not present

## 2022-10-15 DIAGNOSIS — E785 Hyperlipidemia, unspecified: Secondary | ICD-10-CM | POA: Diagnosis not present

## 2022-11-09 DIAGNOSIS — E1159 Type 2 diabetes mellitus with other circulatory complications: Secondary | ICD-10-CM | POA: Diagnosis not present

## 2022-11-09 DIAGNOSIS — F314 Bipolar disorder, current episode depressed, severe, without psychotic features: Secondary | ICD-10-CM | POA: Diagnosis not present

## 2022-12-10 DIAGNOSIS — F314 Bipolar disorder, current episode depressed, severe, without psychotic features: Secondary | ICD-10-CM | POA: Diagnosis not present

## 2022-12-10 DIAGNOSIS — E1159 Type 2 diabetes mellitus with other circulatory complications: Secondary | ICD-10-CM | POA: Diagnosis not present

## 2022-12-27 IMAGING — MG MM BREAST BX W LOC DEV EA AD LESION IMG BX SPEC STEREO GUIDE*L*
6 series · 6 of 6 positions shown · non-contrast
Comparison: Previous exams.
COMPARISON: Previous exams.

Addendum:
CLINICAL DATA: 59-year-old female for tissue sampling of 2 groups
of UPPER LEFT breast calcifications.

EXAM:
LEFT BREAST STEREOTACTIC CORE NEEDLE BIOPSY X 2

[L (1 of 6)]
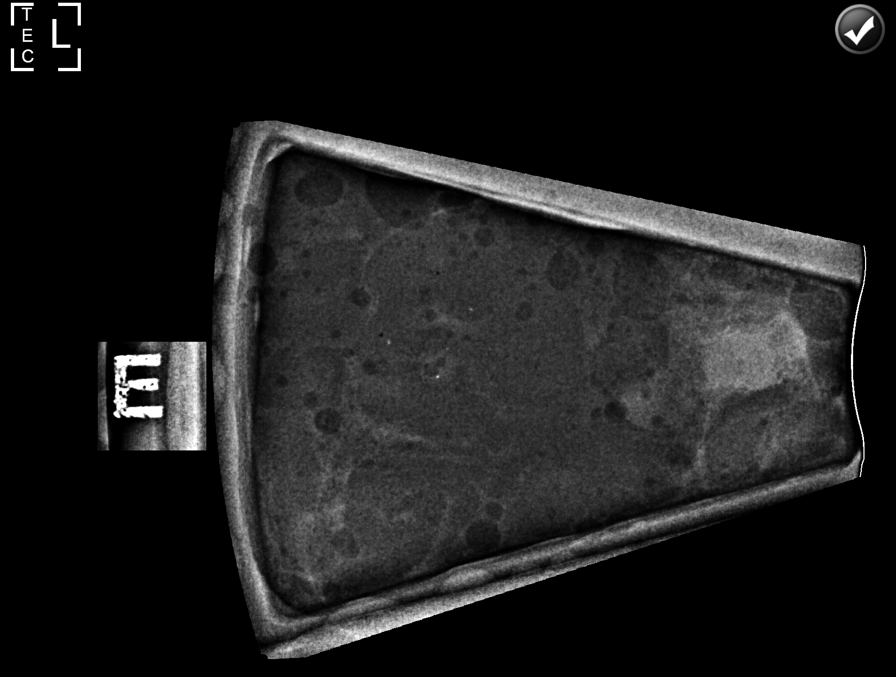

[L (2 of 6)]
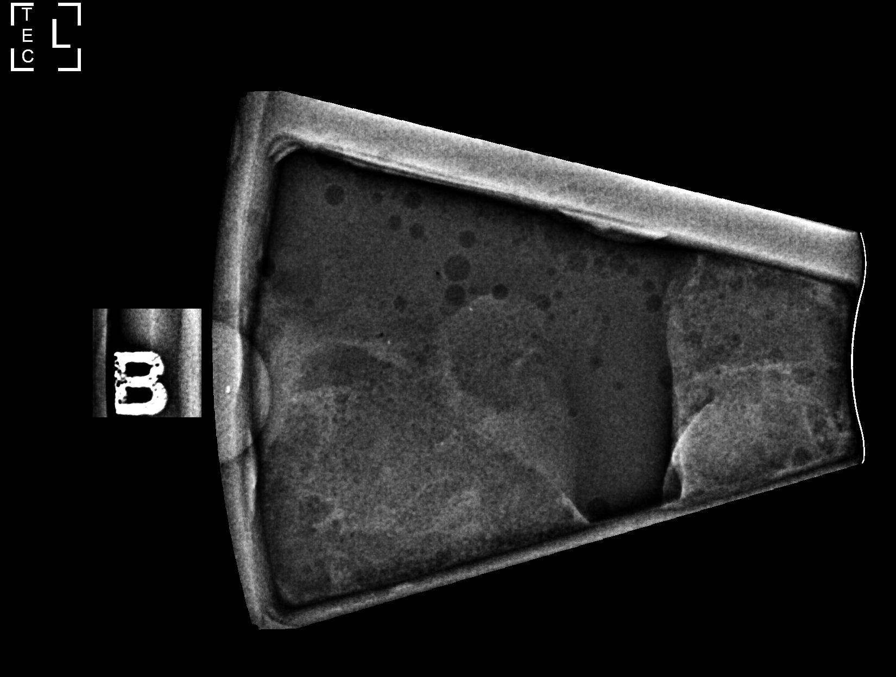

[L (3 of 6)]
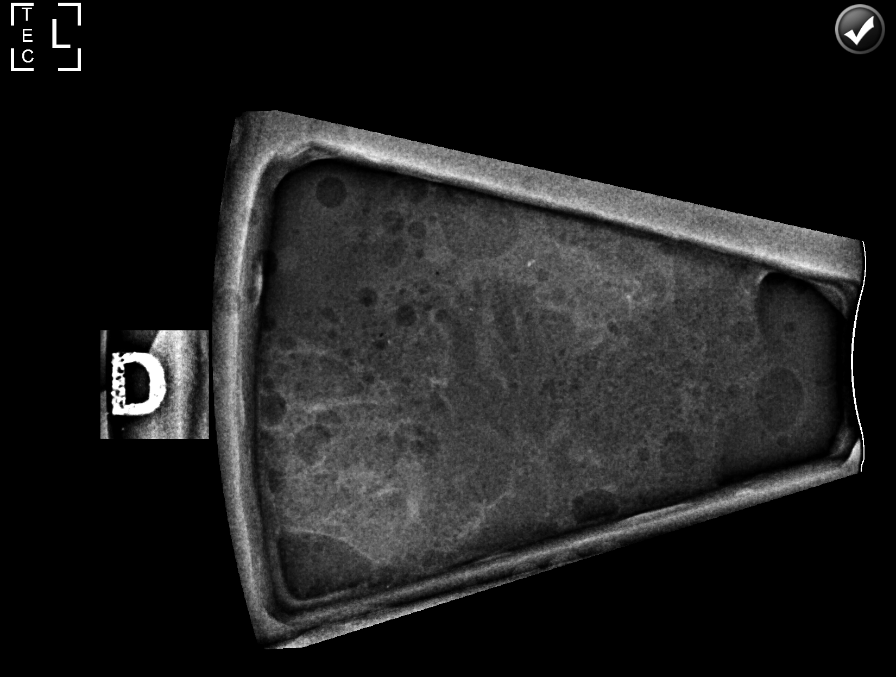

[L (4 of 6)]
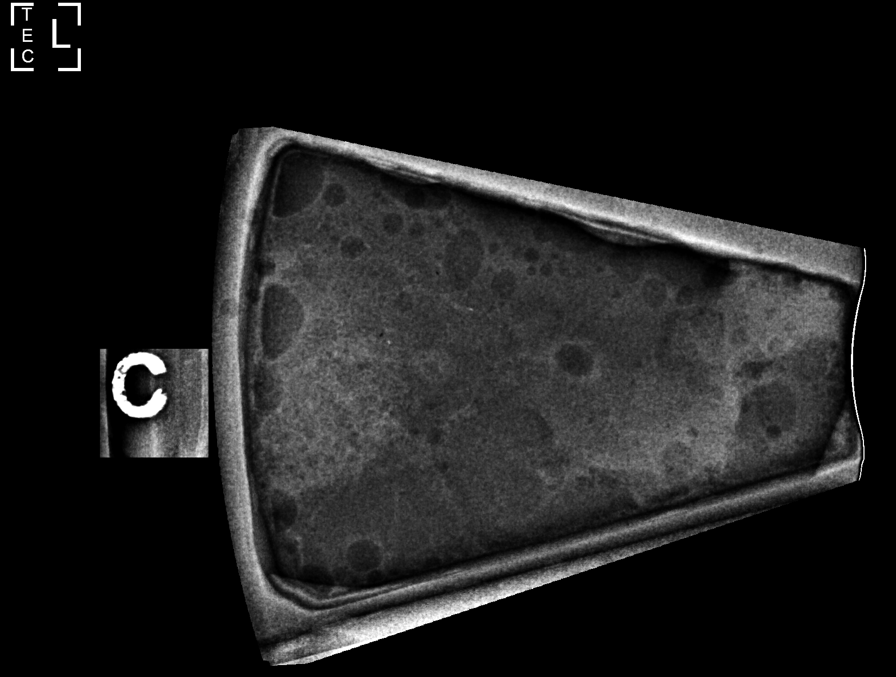

[L (5 of 6)]
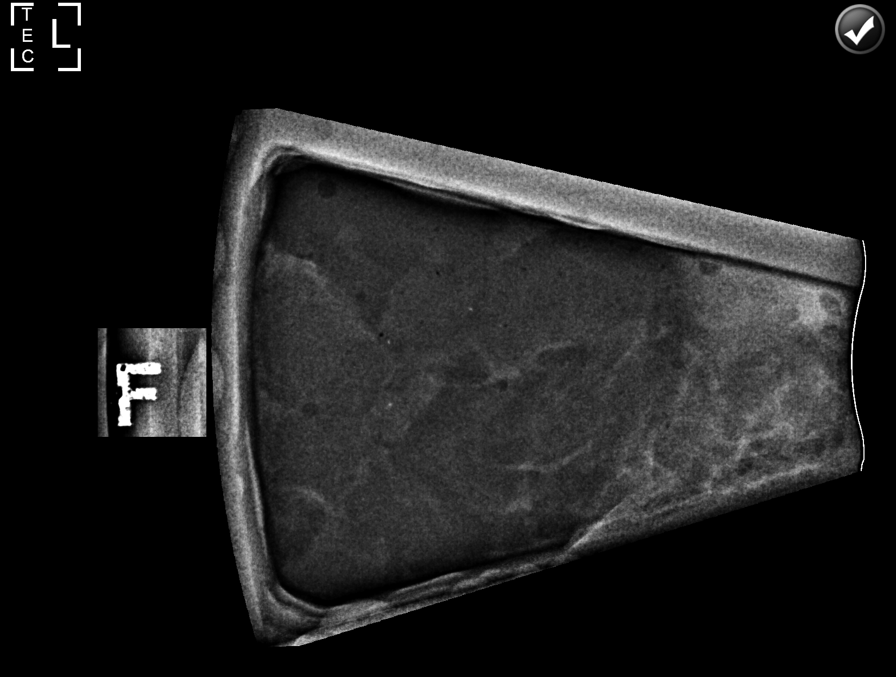

[L (6 of 6)]
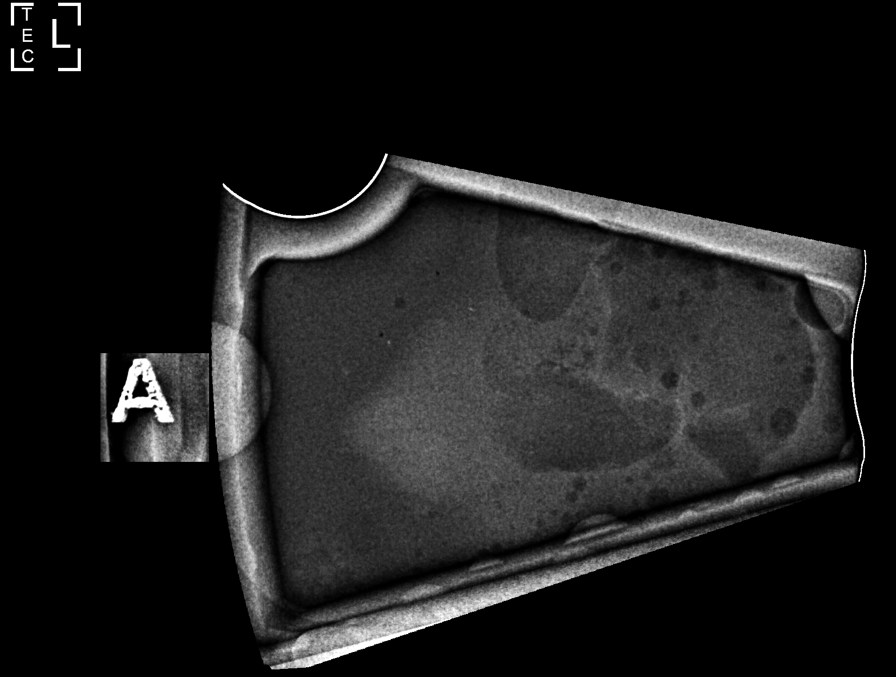

[6 of 6 positions shown; findings below may reference images not displayed]



LEFT BREAST STEREOTACTIC CORE NEEDLE BIOPSY #1 (UPPER-OUTER LEFT
breast calcifications-COIL clip):

Using sterile technique and 1% Lidocaine as local anesthetic, under
stereotactic guidance, a 9 gauge vacuum assisted device was used to
perform core needle biopsy of calcifications within the UPPER-OUTER
LEFT breast using a SUPERIOR approach. Specimen radiograph was
performed showing calcifications. Specimens with calcifications are
identified for pathology.

At the conclusion of the procedure, a COIL tissue marker clip was
deployed into the biopsy cavity. Follow-up 2-view mammogram was
performed and dictated separately.

LEFT BREAST STEREOTACTIC CORE NEEDLE BIOPSY #2 (UPPER LEFT breast
calcifications-X clip) using sterile technique and 1% Lidocaine as
local anesthetic, under stereotactic guidance, a 9 gauge vacuum
assisted device was used to perform core needle biopsy of 0.4 cm
group of UPPER LEFT breast calcifications using a SUPERIOR approach.
Specimen radiograph was performed showing calcifications. Specimens
with calcifications are identified for pathology.

At the conclusion of the procedure, an X tissue marker clip was
deployed into the biopsy cavity. Follow-up 2-view mammogram was
performed and dictated separately.
IMPRESSION: Stereotactic-guided biopsy of 2 groups of LEFT breast calcifications
- 1 group in the UPPER OUTER LEFT breast and 1 group in the UPPER
LEFT breast. No apparent complications.

ADDENDUM:
Pathology revealed FIBROADENOMATOID CHANGE WITH CALCIFICATIONS- NO
EVIDENCE OF MALIGNANCY of the LEFT breast, upper outer quadrant,
(coil clip). This was found to be concordant by Dr. Chioke Sudais.

Pathology revealed FIBROADENOMATOID CHANGE WITH CALCIFICATIONS- NO
EVIDENCE OF MALIGNANCY of the LEFT breast, upper, (X clip). This was
found to be concordant by Dr. Chioke Sudais.

Pathology results were discussed with the patient by telephone. The
patient reported doing well after the biopsies with tenderness at
the sites. Post biopsy instructions and care were reviewed and
questions were answered. The patient was encouraged to call The

The patient was instructed to return for annual screening
mammography and informed a reminder notice would be sent regarding
this appointment.

Pathology results reported by Andelka Marijana Savanovic RN on 08/22/2020.



LEFT BREAST STEREOTACTIC CORE NEEDLE BIOPSY #1 (UPPER-OUTER LEFT
breast calcifications-COIL clip):

Using sterile technique and 1% Lidocaine as local anesthetic, under
stereotactic guidance, a 9 gauge vacuum assisted device was used to
perform core needle biopsy of calcifications within the UPPER-OUTER
LEFT breast using a SUPERIOR approach. Specimen radiograph was
performed showing calcifications. Specimens with calcifications are
identified for pathology.

At the conclusion of the procedure, a COIL tissue marker clip was
deployed into the biopsy cavity. Follow-up 2-view mammogram was
performed and dictated separately.

LEFT BREAST STEREOTACTIC CORE NEEDLE BIOPSY #2 (UPPER LEFT breast
calcifications-X clip) using sterile technique and 1% Lidocaine as
local anesthetic, under stereotactic guidance, a 9 gauge vacuum
assisted device was used to perform core needle biopsy of 0.4 cm
group of UPPER LEFT breast calcifications using a SUPERIOR approach.
Specimen radiograph was performed showing calcifications. Specimens
with calcifications are identified for pathology.

At the conclusion of the procedure, an X tissue marker clip was
deployed into the biopsy cavity. Follow-up 2-view mammogram was
performed and dictated separately.
IMPRESSION: Stereotactic-guided biopsy of 2 groups of LEFT breast calcifications
- 1 group in the UPPER OUTER LEFT breast and 1 group in the UPPER
LEFT breast. No apparent complications.

## 2022-12-27 IMAGING — MG MM BREAST BX W LOC DEV 1ST LESION IMAGE BX SPEC STEREO GUIDE*L*
2 series · 2 of 2 positions shown · non-contrast
Comparison: Previous exams.
COMPARISON: Previous exams.

Addendum:
CLINICAL DATA: 59-year-old female for tissue sampling of 2 groups
of UPPER LEFT breast calcifications.

EXAM:
LEFT BREAST STEREOTACTIC CORE NEEDLE BIOPSY X 2

[R (1 of 2)]
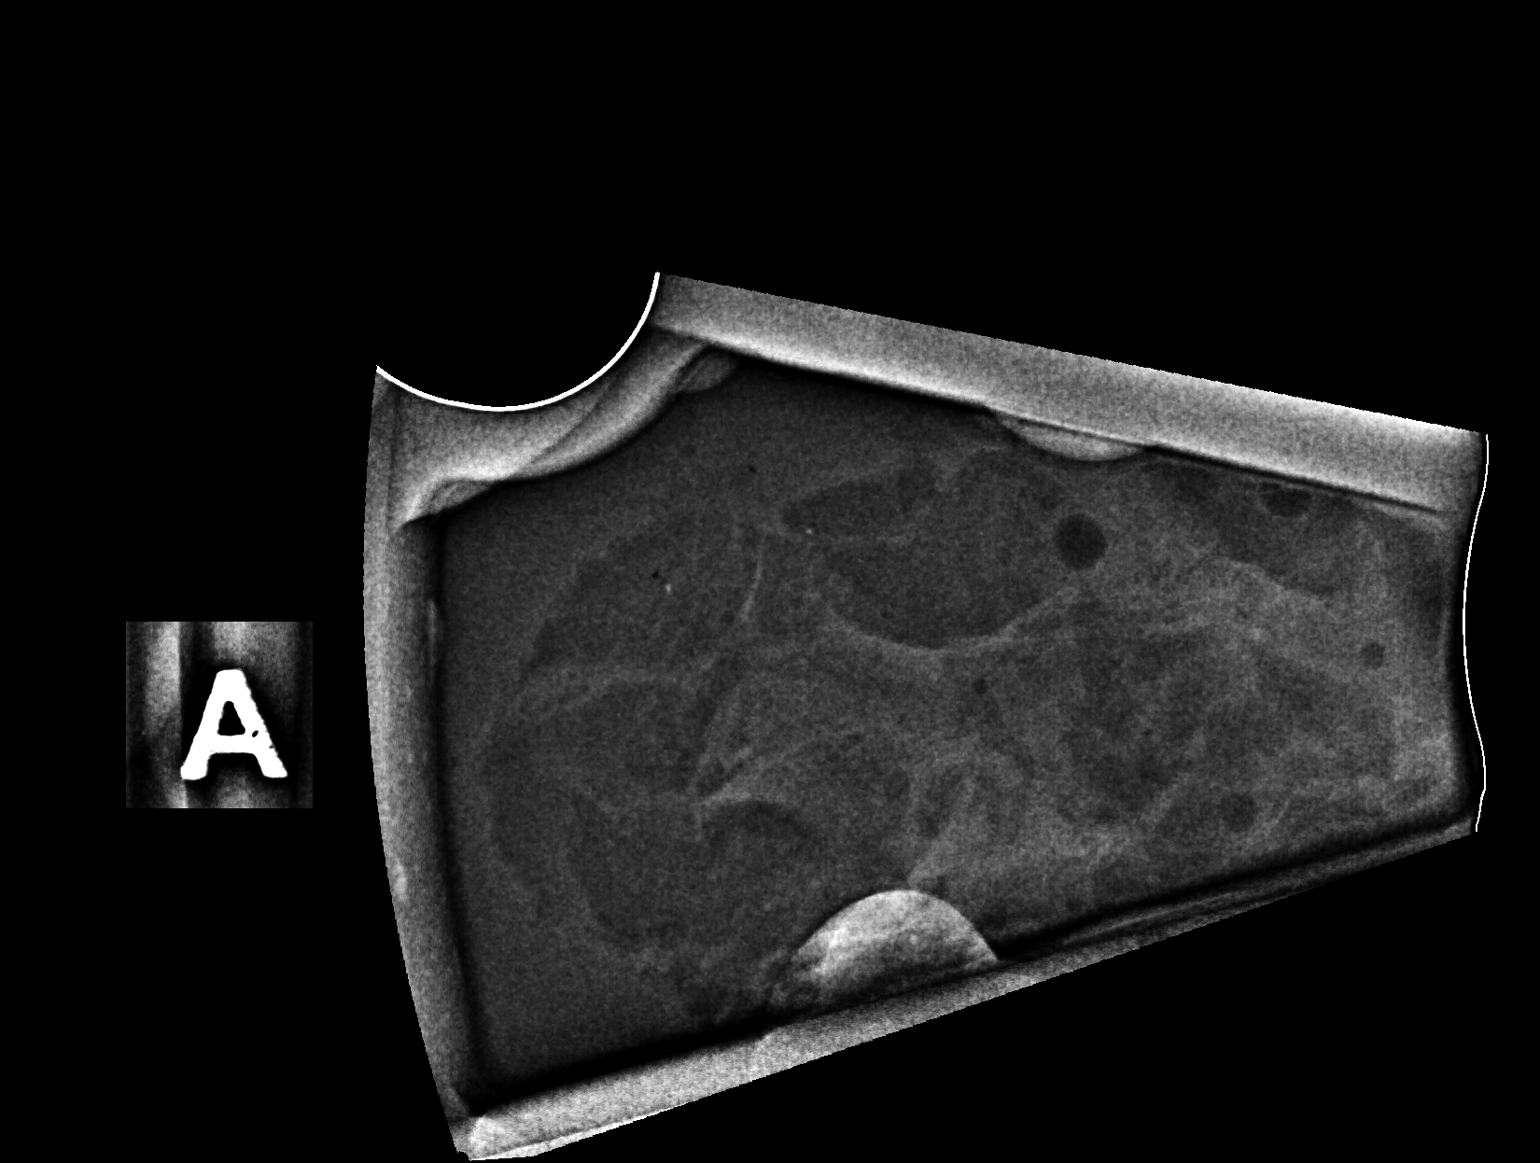

[R (2 of 2)]
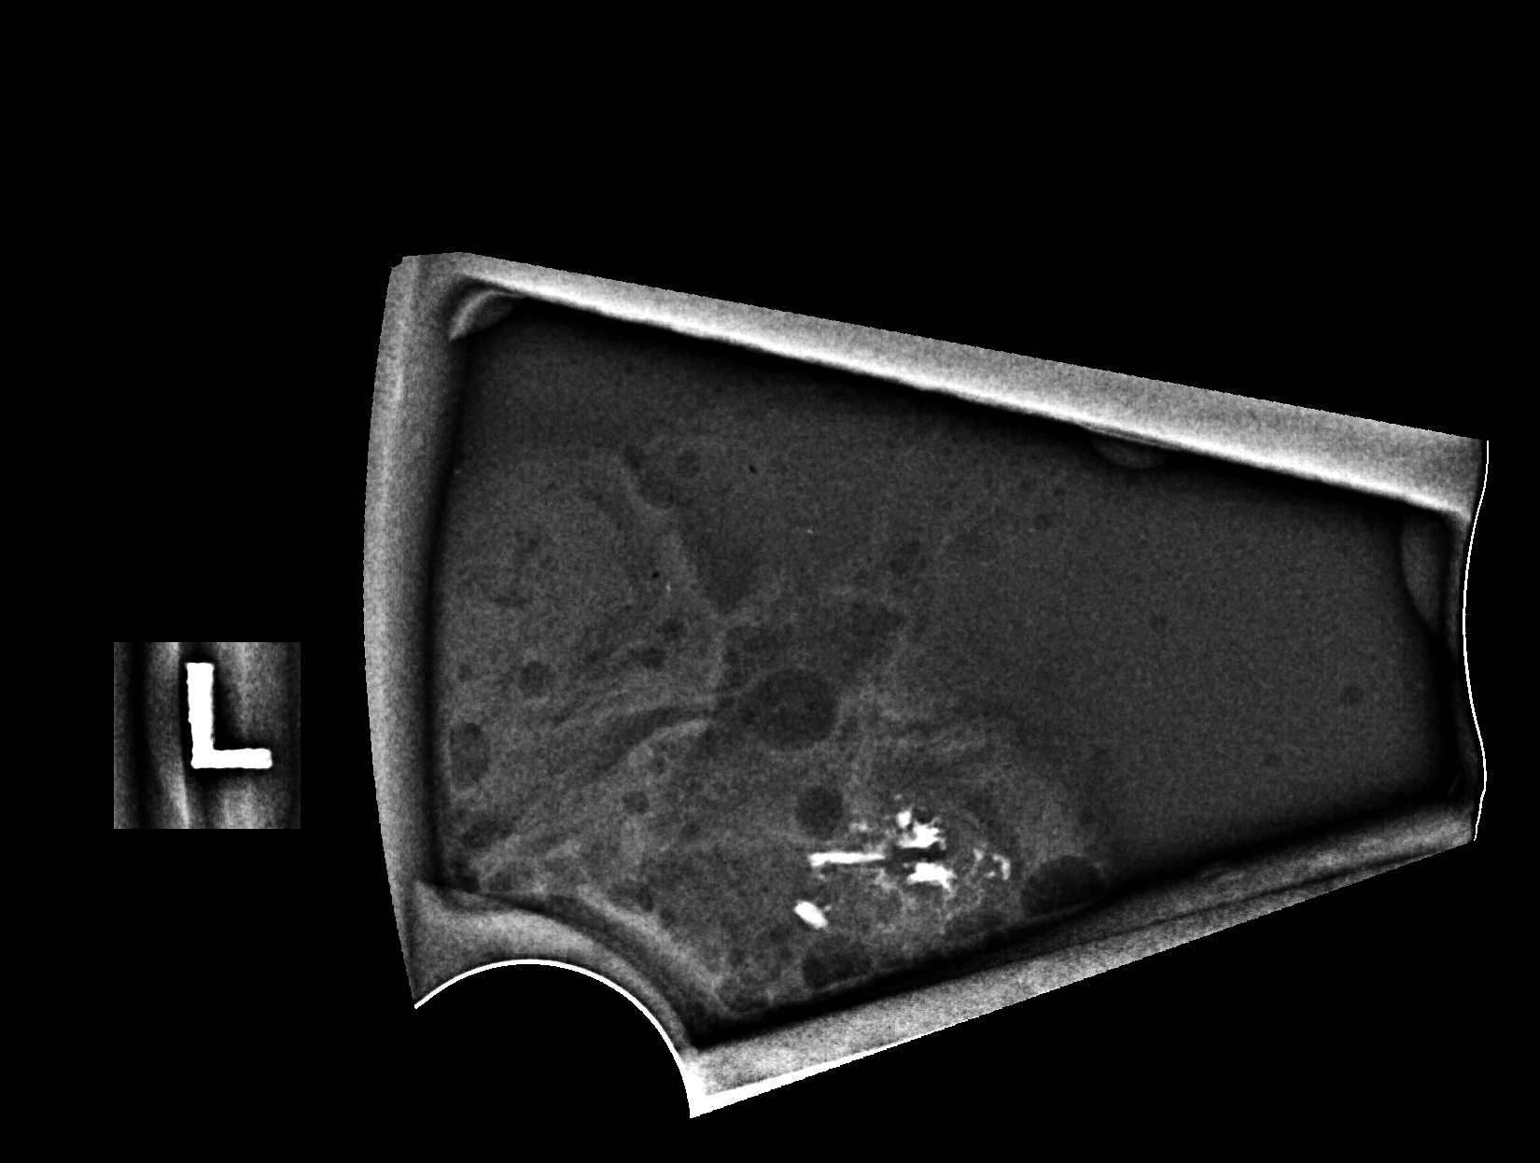

[2 of 2 positions shown; findings below may reference images not displayed]



LEFT BREAST STEREOTACTIC CORE NEEDLE BIOPSY #1 (UPPER-OUTER LEFT
breast calcifications-COIL clip):

Using sterile technique and 1% Lidocaine as local anesthetic, under
stereotactic guidance, a 9 gauge vacuum assisted device was used to
perform core needle biopsy of calcifications within the UPPER-OUTER
LEFT breast using a SUPERIOR approach. Specimen radiograph was
performed showing calcifications. Specimens with calcifications are
identified for pathology.

At the conclusion of the procedure, a COIL tissue marker clip was
deployed into the biopsy cavity. Follow-up 2-view mammogram was
performed and dictated separately.

LEFT BREAST STEREOTACTIC CORE NEEDLE BIOPSY #2 (UPPER LEFT breast
calcifications-X clip) using sterile technique and 1% Lidocaine as
local anesthetic, under stereotactic guidance, a 9 gauge vacuum
assisted device was used to perform core needle biopsy of 0.4 cm
group of UPPER LEFT breast calcifications using a SUPERIOR approach.
Specimen radiograph was performed showing calcifications. Specimens
with calcifications are identified for pathology.

At the conclusion of the procedure, an X tissue marker clip was
deployed into the biopsy cavity. Follow-up 2-view mammogram was
performed and dictated separately.
IMPRESSION: Stereotactic-guided biopsy of 2 groups of LEFT breast calcifications
- 1 group in the UPPER OUTER LEFT breast and 1 group in the UPPER
LEFT breast. No apparent complications.

ADDENDUM:
Pathology revealed FIBROADENOMATOID CHANGE WITH CALCIFICATIONS- NO
EVIDENCE OF MALIGNANCY of the LEFT breast, upper outer quadrant,
(coil clip). This was found to be concordant by Dr. Chioke Sudais.

Pathology revealed FIBROADENOMATOID CHANGE WITH CALCIFICATIONS- NO
EVIDENCE OF MALIGNANCY of the LEFT breast, upper, (X clip). This was
found to be concordant by Dr. Chioke Sudais.

Pathology results were discussed with the patient by telephone. The
patient reported doing well after the biopsies with tenderness at
the sites. Post biopsy instructions and care were reviewed and
questions were answered. The patient was encouraged to call The

The patient was instructed to return for annual screening
mammography and informed a reminder notice would be sent regarding
this appointment.

Pathology results reported by Andelka Marijana Savanovic RN on 08/22/2020.



LEFT BREAST STEREOTACTIC CORE NEEDLE BIOPSY #1 (UPPER-OUTER LEFT
breast calcifications-COIL clip):

Using sterile technique and 1% Lidocaine as local anesthetic, under
stereotactic guidance, a 9 gauge vacuum assisted device was used to
perform core needle biopsy of calcifications within the UPPER-OUTER
LEFT breast using a SUPERIOR approach. Specimen radiograph was
performed showing calcifications. Specimens with calcifications are
identified for pathology.

At the conclusion of the procedure, a COIL tissue marker clip was
deployed into the biopsy cavity. Follow-up 2-view mammogram was
performed and dictated separately.

LEFT BREAST STEREOTACTIC CORE NEEDLE BIOPSY #2 (UPPER LEFT breast
calcifications-X clip) using sterile technique and 1% Lidocaine as
local anesthetic, under stereotactic guidance, a 9 gauge vacuum
assisted device was used to perform core needle biopsy of 0.4 cm
group of UPPER LEFT breast calcifications using a SUPERIOR approach.
Specimen radiograph was performed showing calcifications. Specimens
with calcifications are identified for pathology.

At the conclusion of the procedure, an X tissue marker clip was
deployed into the biopsy cavity. Follow-up 2-view mammogram was
performed and dictated separately.
IMPRESSION: Stereotactic-guided biopsy of 2 groups of LEFT breast calcifications
- 1 group in the UPPER OUTER LEFT breast and 1 group in the UPPER
LEFT breast. No apparent complications.

## 2023-01-08 DIAGNOSIS — E1169 Type 2 diabetes mellitus with other specified complication: Secondary | ICD-10-CM | POA: Diagnosis not present

## 2023-01-08 DIAGNOSIS — E1159 Type 2 diabetes mellitus with other circulatory complications: Secondary | ICD-10-CM | POA: Diagnosis not present

## 2023-01-10 DIAGNOSIS — F314 Bipolar disorder, current episode depressed, severe, without psychotic features: Secondary | ICD-10-CM | POA: Diagnosis not present

## 2023-01-10 DIAGNOSIS — E1159 Type 2 diabetes mellitus with other circulatory complications: Secondary | ICD-10-CM | POA: Diagnosis not present

## 2023-02-09 DIAGNOSIS — E1159 Type 2 diabetes mellitus with other circulatory complications: Secondary | ICD-10-CM | POA: Diagnosis not present

## 2023-02-09 DIAGNOSIS — F314 Bipolar disorder, current episode depressed, severe, without psychotic features: Secondary | ICD-10-CM | POA: Diagnosis not present

## 2023-03-11 DIAGNOSIS — E785 Hyperlipidemia, unspecified: Secondary | ICD-10-CM | POA: Diagnosis not present

## 2023-03-11 DIAGNOSIS — I152 Hypertension secondary to endocrine disorders: Secondary | ICD-10-CM | POA: Diagnosis not present

## 2023-03-11 DIAGNOSIS — E1159 Type 2 diabetes mellitus with other circulatory complications: Secondary | ICD-10-CM | POA: Diagnosis not present

## 2023-03-11 DIAGNOSIS — Z6833 Body mass index (BMI) 33.0-33.9, adult: Secondary | ICD-10-CM | POA: Diagnosis not present

## 2023-03-11 DIAGNOSIS — Z23 Encounter for immunization: Secondary | ICD-10-CM | POA: Diagnosis not present

## 2023-03-11 DIAGNOSIS — E1169 Type 2 diabetes mellitus with other specified complication: Secondary | ICD-10-CM | POA: Diagnosis not present

## 2023-03-12 DIAGNOSIS — E1159 Type 2 diabetes mellitus with other circulatory complications: Secondary | ICD-10-CM | POA: Diagnosis not present

## 2023-03-12 DIAGNOSIS — F314 Bipolar disorder, current episode depressed, severe, without psychotic features: Secondary | ICD-10-CM | POA: Diagnosis not present

## 2023-03-30 DIAGNOSIS — E1159 Type 2 diabetes mellitus with other circulatory complications: Secondary | ICD-10-CM | POA: Diagnosis not present

## 2023-03-30 DIAGNOSIS — Z6833 Body mass index (BMI) 33.0-33.9, adult: Secondary | ICD-10-CM | POA: Diagnosis not present

## 2023-03-30 DIAGNOSIS — E1169 Type 2 diabetes mellitus with other specified complication: Secondary | ICD-10-CM | POA: Diagnosis not present

## 2023-04-11 DIAGNOSIS — E1159 Type 2 diabetes mellitus with other circulatory complications: Secondary | ICD-10-CM | POA: Diagnosis not present

## 2023-04-11 DIAGNOSIS — F314 Bipolar disorder, current episode depressed, severe, without psychotic features: Secondary | ICD-10-CM | POA: Diagnosis not present

## 2023-05-12 DIAGNOSIS — F314 Bipolar disorder, current episode depressed, severe, without psychotic features: Secondary | ICD-10-CM | POA: Diagnosis not present

## 2023-05-12 DIAGNOSIS — E1159 Type 2 diabetes mellitus with other circulatory complications: Secondary | ICD-10-CM | POA: Diagnosis not present

## 2023-05-20 DIAGNOSIS — Z20822 Contact with and (suspected) exposure to covid-19: Secondary | ICD-10-CM | POA: Diagnosis not present

## 2023-05-20 DIAGNOSIS — U071 COVID-19: Secondary | ICD-10-CM | POA: Diagnosis not present

## 2023-05-20 DIAGNOSIS — R059 Cough, unspecified: Secondary | ICD-10-CM | POA: Diagnosis not present

## 2023-05-20 DIAGNOSIS — R0602 Shortness of breath: Secondary | ICD-10-CM | POA: Diagnosis not present

## 2023-05-20 DIAGNOSIS — S0083XA Contusion of other part of head, initial encounter: Secondary | ICD-10-CM | POA: Diagnosis not present

## 2023-06-12 DIAGNOSIS — F314 Bipolar disorder, current episode depressed, severe, without psychotic features: Secondary | ICD-10-CM | POA: Diagnosis not present

## 2023-06-12 DIAGNOSIS — E1159 Type 2 diabetes mellitus with other circulatory complications: Secondary | ICD-10-CM | POA: Diagnosis not present

## 2023-07-07 DIAGNOSIS — E1169 Type 2 diabetes mellitus with other specified complication: Secondary | ICD-10-CM | POA: Diagnosis not present

## 2023-07-07 DIAGNOSIS — Z6834 Body mass index (BMI) 34.0-34.9, adult: Secondary | ICD-10-CM | POA: Diagnosis not present

## 2023-07-07 DIAGNOSIS — E1159 Type 2 diabetes mellitus with other circulatory complications: Secondary | ICD-10-CM | POA: Diagnosis not present

## 2023-07-07 DIAGNOSIS — E669 Obesity, unspecified: Secondary | ICD-10-CM | POA: Diagnosis not present

## 2023-07-10 DIAGNOSIS — E1159 Type 2 diabetes mellitus with other circulatory complications: Secondary | ICD-10-CM | POA: Diagnosis not present

## 2023-07-10 DIAGNOSIS — F314 Bipolar disorder, current episode depressed, severe, without psychotic features: Secondary | ICD-10-CM | POA: Diagnosis not present

## 2023-07-14 DIAGNOSIS — E1169 Type 2 diabetes mellitus with other specified complication: Secondary | ICD-10-CM | POA: Diagnosis not present

## 2023-07-14 DIAGNOSIS — Z6833 Body mass index (BMI) 33.0-33.9, adult: Secondary | ICD-10-CM | POA: Diagnosis not present

## 2023-07-14 DIAGNOSIS — E66811 Obesity, class 1: Secondary | ICD-10-CM | POA: Diagnosis not present

## 2023-07-16 DIAGNOSIS — E1169 Type 2 diabetes mellitus with other specified complication: Secondary | ICD-10-CM | POA: Diagnosis not present

## 2023-07-16 DIAGNOSIS — E1159 Type 2 diabetes mellitus with other circulatory complications: Secondary | ICD-10-CM | POA: Diagnosis not present

## 2023-07-21 DIAGNOSIS — E66811 Obesity, class 1: Secondary | ICD-10-CM | POA: Diagnosis not present

## 2023-07-21 DIAGNOSIS — Z6833 Body mass index (BMI) 33.0-33.9, adult: Secondary | ICD-10-CM | POA: Diagnosis not present

## 2023-07-21 DIAGNOSIS — E1169 Type 2 diabetes mellitus with other specified complication: Secondary | ICD-10-CM | POA: Diagnosis not present

## 2023-07-22 DIAGNOSIS — Z6832 Body mass index (BMI) 32.0-32.9, adult: Secondary | ICD-10-CM | POA: Diagnosis not present

## 2023-07-22 DIAGNOSIS — E785 Hyperlipidemia, unspecified: Secondary | ICD-10-CM | POA: Diagnosis not present

## 2023-07-22 DIAGNOSIS — E1169 Type 2 diabetes mellitus with other specified complication: Secondary | ICD-10-CM | POA: Diagnosis not present

## 2023-07-22 DIAGNOSIS — I152 Hypertension secondary to endocrine disorders: Secondary | ICD-10-CM | POA: Diagnosis not present

## 2023-07-22 DIAGNOSIS — E1159 Type 2 diabetes mellitus with other circulatory complications: Secondary | ICD-10-CM | POA: Diagnosis not present

## 2023-07-28 DIAGNOSIS — E66811 Obesity, class 1: Secondary | ICD-10-CM | POA: Diagnosis not present

## 2023-07-28 DIAGNOSIS — E1169 Type 2 diabetes mellitus with other specified complication: Secondary | ICD-10-CM | POA: Diagnosis not present

## 2023-07-28 DIAGNOSIS — Z6832 Body mass index (BMI) 32.0-32.9, adult: Secondary | ICD-10-CM | POA: Diagnosis not present

## 2023-08-04 DIAGNOSIS — E66811 Obesity, class 1: Secondary | ICD-10-CM | POA: Diagnosis not present

## 2023-08-04 DIAGNOSIS — Z6832 Body mass index (BMI) 32.0-32.9, adult: Secondary | ICD-10-CM | POA: Diagnosis not present

## 2023-08-04 DIAGNOSIS — E1169 Type 2 diabetes mellitus with other specified complication: Secondary | ICD-10-CM | POA: Diagnosis not present

## 2023-08-10 DIAGNOSIS — Z6832 Body mass index (BMI) 32.0-32.9, adult: Secondary | ICD-10-CM | POA: Diagnosis not present

## 2023-08-10 DIAGNOSIS — M797 Fibromyalgia: Secondary | ICD-10-CM | POA: Diagnosis not present

## 2023-08-10 DIAGNOSIS — E669 Obesity, unspecified: Secondary | ICD-10-CM | POA: Diagnosis not present

## 2023-08-13 ENCOUNTER — Other Ambulatory Visit: Payer: Self-pay | Admitting: Family Medicine

## 2023-08-13 DIAGNOSIS — Z1231 Encounter for screening mammogram for malignant neoplasm of breast: Secondary | ICD-10-CM

## 2023-08-18 ENCOUNTER — Ambulatory Visit
Admission: RE | Admit: 2023-08-18 | Discharge: 2023-08-18 | Disposition: A | Discharge status: Home / Self Care | Source: Ambulatory Visit | Attending: Family Medicine | Admitting: Family Medicine

## 2023-08-18 DIAGNOSIS — Z6831 Body mass index (BMI) 31.0-31.9, adult: Secondary | ICD-10-CM | POA: Diagnosis not present

## 2023-08-18 DIAGNOSIS — Z1231 Encounter for screening mammogram for malignant neoplasm of breast: Secondary | ICD-10-CM

## 2023-08-18 DIAGNOSIS — E66811 Obesity, class 1: Secondary | ICD-10-CM | POA: Diagnosis not present

## 2023-08-18 DIAGNOSIS — E1169 Type 2 diabetes mellitus with other specified complication: Secondary | ICD-10-CM | POA: Diagnosis not present

## 2023-09-01 DIAGNOSIS — E1159 Type 2 diabetes mellitus with other circulatory complications: Secondary | ICD-10-CM | POA: Diagnosis not present

## 2023-09-01 DIAGNOSIS — Z6831 Body mass index (BMI) 31.0-31.9, adult: Secondary | ICD-10-CM | POA: Diagnosis not present

## 2023-09-09 DIAGNOSIS — E669 Obesity, unspecified: Secondary | ICD-10-CM | POA: Diagnosis not present

## 2023-09-09 DIAGNOSIS — M797 Fibromyalgia: Secondary | ICD-10-CM | POA: Diagnosis not present

## 2023-09-09 DIAGNOSIS — Z6832 Body mass index (BMI) 32.0-32.9, adult: Secondary | ICD-10-CM | POA: Diagnosis not present

## 2023-09-29 DIAGNOSIS — E66811 Obesity, class 1: Secondary | ICD-10-CM | POA: Diagnosis not present

## 2023-09-29 DIAGNOSIS — Z6833 Body mass index (BMI) 33.0-33.9, adult: Secondary | ICD-10-CM | POA: Diagnosis not present

## 2023-10-10 DIAGNOSIS — E669 Obesity, unspecified: Secondary | ICD-10-CM | POA: Diagnosis not present

## 2023-10-10 DIAGNOSIS — Z6832 Body mass index (BMI) 32.0-32.9, adult: Secondary | ICD-10-CM | POA: Diagnosis not present

## 2023-10-10 DIAGNOSIS — M797 Fibromyalgia: Secondary | ICD-10-CM | POA: Diagnosis not present

## 2023-10-14 DIAGNOSIS — E1159 Type 2 diabetes mellitus with other circulatory complications: Secondary | ICD-10-CM | POA: Diagnosis not present

## 2023-10-14 DIAGNOSIS — E1169 Type 2 diabetes mellitus with other specified complication: Secondary | ICD-10-CM | POA: Diagnosis not present

## 2023-10-21 DIAGNOSIS — F314 Bipolar disorder, current episode depressed, severe, without psychotic features: Secondary | ICD-10-CM | POA: Diagnosis not present

## 2023-10-21 DIAGNOSIS — E1159 Type 2 diabetes mellitus with other circulatory complications: Secondary | ICD-10-CM | POA: Diagnosis not present

## 2023-10-21 DIAGNOSIS — M25511 Pain in right shoulder: Secondary | ICD-10-CM | POA: Diagnosis not present

## 2023-10-21 DIAGNOSIS — E785 Hyperlipidemia, unspecified: Secondary | ICD-10-CM | POA: Diagnosis not present

## 2023-10-21 DIAGNOSIS — Z6830 Body mass index (BMI) 30.0-30.9, adult: Secondary | ICD-10-CM | POA: Diagnosis not present

## 2023-10-21 DIAGNOSIS — I152 Hypertension secondary to endocrine disorders: Secondary | ICD-10-CM | POA: Diagnosis not present

## 2023-10-27 DIAGNOSIS — E66811 Obesity, class 1: Secondary | ICD-10-CM | POA: Diagnosis not present

## 2023-10-27 DIAGNOSIS — Z6830 Body mass index (BMI) 30.0-30.9, adult: Secondary | ICD-10-CM | POA: Diagnosis not present

## 2023-11-09 DIAGNOSIS — E669 Obesity, unspecified: Secondary | ICD-10-CM | POA: Diagnosis not present

## 2023-11-09 DIAGNOSIS — M797 Fibromyalgia: Secondary | ICD-10-CM | POA: Diagnosis not present

## 2023-11-09 DIAGNOSIS — Z6832 Body mass index (BMI) 32.0-32.9, adult: Secondary | ICD-10-CM | POA: Diagnosis not present

## 2023-11-10 DIAGNOSIS — E66811 Obesity, class 1: Secondary | ICD-10-CM | POA: Diagnosis not present

## 2023-11-10 DIAGNOSIS — E1169 Type 2 diabetes mellitus with other specified complication: Secondary | ICD-10-CM | POA: Diagnosis not present

## 2023-12-10 DIAGNOSIS — E669 Obesity, unspecified: Secondary | ICD-10-CM | POA: Diagnosis not present

## 2023-12-10 DIAGNOSIS — M797 Fibromyalgia: Secondary | ICD-10-CM | POA: Diagnosis not present

## 2023-12-10 DIAGNOSIS — Z6832 Body mass index (BMI) 32.0-32.9, adult: Secondary | ICD-10-CM | POA: Diagnosis not present

## 2024-01-10 DIAGNOSIS — E669 Obesity, unspecified: Secondary | ICD-10-CM | POA: Diagnosis not present

## 2024-01-10 DIAGNOSIS — M797 Fibromyalgia: Secondary | ICD-10-CM | POA: Diagnosis not present

## 2024-01-10 DIAGNOSIS — Z6832 Body mass index (BMI) 32.0-32.9, adult: Secondary | ICD-10-CM | POA: Diagnosis not present

## 2024-02-09 DIAGNOSIS — M797 Fibromyalgia: Secondary | ICD-10-CM | POA: Diagnosis not present

## 2024-02-09 DIAGNOSIS — E669 Obesity, unspecified: Secondary | ICD-10-CM | POA: Diagnosis not present

## 2024-02-09 DIAGNOSIS — Z6832 Body mass index (BMI) 32.0-32.9, adult: Secondary | ICD-10-CM | POA: Diagnosis not present

## 2024-03-11 DIAGNOSIS — Z6832 Body mass index (BMI) 32.0-32.9, adult: Secondary | ICD-10-CM | POA: Diagnosis not present

## 2024-03-11 DIAGNOSIS — M797 Fibromyalgia: Secondary | ICD-10-CM | POA: Diagnosis not present

## 2024-03-11 DIAGNOSIS — E669 Obesity, unspecified: Secondary | ICD-10-CM | POA: Diagnosis not present

## 2024-03-14 ENCOUNTER — Encounter: Payer: Self-pay | Admitting: Pharmacist
